# Patient Record
Sex: Female | Born: 1995 | Race: White | Hispanic: No | Marital: Single | Smoking: Never smoker
Health system: Southern US, Community
[De-identification: ages and names within clinical notes are randomized; demographics above are authoritative.]

## PROBLEM LIST (undated history)

## (undated) DIAGNOSIS — A749 Chlamydial infection, unspecified: Secondary | ICD-10-CM

## (undated) DIAGNOSIS — T7421XA Adult sexual abuse, confirmed, initial encounter: Secondary | ICD-10-CM

## (undated) DIAGNOSIS — F419 Anxiety disorder, unspecified: Secondary | ICD-10-CM

## (undated) DIAGNOSIS — J45909 Unspecified asthma, uncomplicated: Secondary | ICD-10-CM

## (undated) DIAGNOSIS — G4700 Insomnia, unspecified: Secondary | ICD-10-CM

## (undated) DIAGNOSIS — F410 Panic disorder [episodic paroxysmal anxiety] without agoraphobia: Secondary | ICD-10-CM

## (undated) DIAGNOSIS — J309 Allergic rhinitis, unspecified: Secondary | ICD-10-CM

## (undated) DIAGNOSIS — F32A Depression, unspecified: Secondary | ICD-10-CM

## (undated) DIAGNOSIS — A6000 Herpesviral infection of urogenital system, unspecified: Secondary | ICD-10-CM

## (undated) DIAGNOSIS — F99 Mental disorder, not otherwise specified: Secondary | ICD-10-CM

## (undated) HISTORY — PX: APPENDECTOMY: SHX000030

## (undated) HISTORY — PX: PR TONSILLECTOMY PRIMARY/SECONDARY AGE 12/>: 42826

## (undated) HISTORY — PX: DENTAL EXTRACTION 1-5 TEETH: SHX004133

## (undated) HISTORY — PX: APPENDECTOMY: SHX54

## (undated) HISTORY — DX: Anxiety disorder, unspecified: F41.9

## (undated) HISTORY — DX: Chlamydial infection, unspecified: A74.9

## (undated) HISTORY — DX: Allergic rhinitis, unspecified: J30.9

## (undated) HISTORY — DX: Panic disorder (episodic paroxysmal anxiety): F41.0

## (undated) HISTORY — DX: Insomnia, unspecified: G47.00

## (undated) HISTORY — DX: Adult sexual abuse, confirmed, initial encounter: T74.21XA

## (undated) HISTORY — DX: Herpesviral infection of urogenital system, unspecified: A60.00

## (undated) HISTORY — DX: Depression, unspecified: F32.A

---
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
## 2014-11-19 NOTE — Discharge Summary (Signed)
PATIENT NAME:  Rachel Mercer, Rachel Mercer MR#:  [id number] DATE OF BIRTH:  1996-01-03  DATE OF ADMISSION:  05/08/2013 DATE OF DISCHARGE: 05/10/2013   FINAL DIAGNOSIS: Acute early appendicitis.   PRINCIPLE PROCEDURE: Laparoscopic appendectomy and CT scan of the abdomen and pelvis.   HOSPITAL COURSE SUMMARY: The patient had an uneventful course. Dr. Anda KraftMarterre performed a laparoscopic appendectomy the evening of 10/10. On postoperative day #1. the patient had moderate pain control. Her diet was able to be slowly advanced. She became ambulatory and was stable for discharge on postoperative day #2. She was eating well. Her abdomen was soft. The incisions were healing nicely. Discharge instructions were provided to the patient.   DISCHARGE MEDICATIONS: Norco 1 to 2 tabs every 4 to 6 hours as needed for pain.   FOLLOWUP: In our office in 1 to 2 weeks.  ____________________________ Redge GainerMark A. Egbert GaribaldiBird, MD mab:aw D: 05/10/2013 09:39:57 ET T: 05/10/2013 09:59:08 ET JOB#: [id number]  cc: Loraine LericheMark A. Egbert GaribaldiBird, MD, <Dictator> Rachel KempMARK A Pernell Lenoir MD ELECTRONICALLY SIGNED 05/14/2013 14:39

## 2014-11-19 NOTE — Op Note (Signed)
PATIENT NAME:  Rachel Mercer, Rachel Mercer MR#:  [id number] DATE OF BIRTH:  17-Feb-1996  DATE OF PROCEDURE:  05/08/2013  OPERATION PERFORMED:  Laparoscopic appendectomy.   PREOPERATIVE DIAGNOSIS:  Acute appendicitis.   POSTOPERATIVE DIAGNOSIS:  Acute appendicitis, early.   SURGEON:  Consuela Mimes, M.D.   ANESTHESIA:  General.   PROCEDURE IN DETAIL:  The patient was placed supine on the Operating Room table and prepped and draped in the usual sterile fashion.  A limited incision was made in the supraumbilical midline and this was carried down through the linea alba and a Hassan cannula was introduced amidst horizontal mattress sutures of 0 Vicryl.  A 15 mmHg CO2 pneumoperitoneum was created and two additional 5 mm trocars were placed under direct visualization.  The patient's appendix was noted to be swollen and injected, but there was no pus or exudate or surrounding fluid.  The mesoappendix was taken down with the Harmonic scalpel and the appendectomy was performed with an Endo GIA stapling device just at the appendiceal base where it met the cecum and the appendix was placed in an Endo Catch bag and extracted from the abdomen via the supraumbilical port.  The right lower quadrant was irrigated with warm normal saline.  This was suctioned clear including the pelvis.  Both ovaries and fallopian tubes were normal.  The omentum was quite diminutive, but I was able to get a small amount of the dependent portion of the omentum over top of the appendiceal stump and then I desufflated and decannulated the abdomen and placed a single 0 PDS suture in the midline fascia and then tied the horizontal mattress sutures one to another.  All three skin sites were closed with subcuticular 5-0 Monocryl and suture strips.  The patient tolerated the procedure well.  There were no complications.     ____________________________ Consuela Mimes, MD wfm:ea D: 05/08/2013 21:16:42 ET T: 05/08/2013 23:22:06  ET JOB#: [id number]  cc: Consuela Mimes, MD, <Dictator> Consuela Mimes, MD Consuela Mimes MD ELECTRONICALLY SIGNED 05/11/2013 3:47

## 2014-11-19 NOTE — H&P (Signed)
Subjective/Chief Complaint rlq abdominal pain   History of Present Illness 19 y/o female with now 3 days of progressive abdominal pain which started in periumbilical area and now has migrated to RLQ, no sick contacts, no diarrhea no n/v, no anorexia.  imaging shows acute appendicitis.   Past History none   Past Med/Surgical Hx:  Denies medical history:   ALLERGIES:  No Known Allergies:    Other Allergies none   HOME MEDICATIONS: Medication Instructions Status  Jolessa extended cycle 30 mcg-0.15 mg oral tablet 1 tab(s) orally once a day Active  clonazePAM 0.5 mg oral tablet 1 tab(s) orally 2 times a day as needed  for anxiety, nervousness Active   Family and Social History:  Family History Non-Contributory   Social History negative tobacco, negative ETOH   Place of Living Home   Review of Systems:  Subjective/Chief Complaint see above   Abdominal Pain Yes   Physical Exam:  GEN no acute distress, thin   HEENT pale conjunctivae, PERRL, hearing intact to voice, moist oral mucosa   NECK supple   RESP normal resp effort  clear BS   CARD regular rate   ABD positive tenderness  no liver/spleen enlargement  no hernia  soft  neg rovsing's focal rlq peritoneal signs   EXTR negative cyanosis/clubbing   SKIN normal to palpation   NEURO cranial nerves intact   PSYCH A+O to time, place, person, good insight   Lab Results: Routine Chem:  10-Oct-14 16:32   Glucose, Serum 79  BUN  6  Creatinine (comp) 0.73  Sodium, Serum 135  Potassium, Serum  3.2  Chloride, Serum 102  CO2, Serum 25  Calcium (Total), Serum 9.1  Anion Gap 8  Osmolality (calc) 267  eGFR (African American) >60  eGFR (Non-African American) >60 (eGFR values <40m/min/1.73 m2 may be an indication of chronic kidney disease (CKD). Calculated eGFR is useful in patients with stable renal function. The eGFR calculation will not be reliable in acutely ill patients when serum creatinine is changing  rapidly. It is not useful in  patients on dialysis. The eGFR calculation may not be applicable to patients at the low and high extremes of body sizes, pregnant women, and vegetarians.)  Routine UA:  10-Oct-14 18:30   Color (UA) Straw  Clarity (UA) Clear  Glucose (UA) Negative  Bilirubin (UA) Negative  Ketones (UA) 2+  Specific Gravity (UA) 1.045  Blood (UA) Negative  pH (UA) 5.0  Protein (UA) Negative  Nitrite (UA) Negative  Leukocyte Esterase (UA) Negative (Result(s) reported on 08 May 2013 at 06:42PM.)  RBC (UA) <1 /HPF  WBC (UA) 2 /HPF  Bacteria (UA) TRACE  Epithelial Cells (UA) 2 /HPF  Mucous (UA) PRESENT (Result(s) reported on 08 May 2013 at 06:42PM.)  Routine Sero:  10-Oct-14 18:30   Pregnancy Test, Urine NEGATIVE (The results of the qualitative urine HCG (Pregnancy Test) should be evaluated in light of other clinical information.  There are limitations to the test which, in certain clinical situations, may result in a false positive or negative result. Thehigh dose hook effect can occur in urine samples with extremely high HCG concentrations.  This effect can produce a negative result in certain situations. It is suggested that results of the qualitative HCG be confirmed by an alternate methodology, such as the quantitative serum beta HCG test.)  Routine Hem:  10-Oct-14 16:32   WBC (CBC)  12.7  RBC (CBC) 4.21  Hemoglobin (CBC) 13.7  Hematocrit (CBC) 39.2  Platelet Count (CBC)  253 (Result(s) reported on 08 May 2013 at 04:50PM.)  MCV 93  MCH 32.5  MCHC 34.9  RDW 12.9   Radiology Results: LabUnknown:    10-Oct-14 15:23, CT Abdomen and Pelvis With Contrast  PACS Image  CT:  CT Abdomen and Pelvis With Contrast  REASON FOR EXAM:    Call Report  [phone]  epigastric painLLQ abd pain   nausea  COMMENTS:       PROCEDURE: CT  - CT ABDOMEN / PELVIS  W  - May 08 2013  3:23PM     RESULT: History: Epigastric pain    Comparison:  None    Technique: Multiple axial  images of the abdomen and pelvis were performed   from the lung bases to the pubic symphysis, with p.o. contrast and with   100 ml of Isovue 370 intravenous contrast.    Findings:  The lung bases are clear. There is no pneumothorax. The heart size is   normal.    The liver demonstrates no focal abnormality. There is no intrahepatic or   extrahepatic biliary ductal dilatation. The gallbladder is unremarkable.   The spleen demonstrates no focal abnormality. The kidneys, adrenal   glands, and pancreas are normal. The bladder is unremarkable.     The stomach, duodenum, small intestine, and large intestine demonstrate   no contrast extravasation or dilatation. The appendix is dilated   measuring 12 mm with mild periappendiceal inflammatory changes most   concerning for acute appendicitis. There is no periappendiceal fluid   collection to suggest an abscess. There is a small amount of pelvic free   fluid. There is no pneumoperitoneum, pneumatosis, or portal venous gas.   There is no lymphadenopathy.   The abdominal aorta is normal in caliber.    The osseous structures are unremarkable.    IMPRESSION:     1. Findings most concerning for acute appendicitis. These findings were   communicated to Carmon Ginsberg on 05/08/2013 at 1539 hours.    Dictation Site: 1        Verified By: Jennette Banker, M.D., MD    Assessment/Admission Diagnosis 19 y/o with acute appendictis   Plan lap appendectomy will discuss with Dr. Leanora Cover   Electronic Signatures: Sherri Rad (MD)  (Signed 10-Oct-14 19:06)  Authored: CHIEF COMPLAINT and HISTORY, PAST MEDICAL/SURGIAL HISTORY, ALLERGIES, Other Allergies, HOME MEDICATIONS, FAMILY AND SOCIAL HISTORY, REVIEW OF SYSTEMS, PHYSICAL EXAM, LABS, Radiology, ASSESSMENT AND PLAN   Last Updated: 10-Oct-14 19:06 by Sherri Rad (MD)

## 2015-02-16 ENCOUNTER — Encounter: Payer: Self-pay | Admitting: Family Medicine

## 2015-02-16 ENCOUNTER — Ambulatory Visit (INDEPENDENT_AMBULATORY_CARE_PROVIDER_SITE_OTHER): Payer: 59 | Admitting: Family Medicine

## 2015-02-16 ENCOUNTER — Other Ambulatory Visit: Payer: Self-pay

## 2015-02-16 VITALS — BP 108/68 | HR 95 | Temp 98.2°F | Resp 16 | Wt 127.8 lb

## 2015-02-16 DIAGNOSIS — J302 Other seasonal allergic rhinitis: Secondary | ICD-10-CM

## 2015-02-16 DIAGNOSIS — T485X1A Poisoning by other anti-common-cold drugs, accidental (unintentional), initial encounter: Secondary | ICD-10-CM | POA: Diagnosis not present

## 2015-02-16 DIAGNOSIS — T485X5A Adverse effect of other anti-common-cold drugs, initial encounter: Secondary | ICD-10-CM | POA: Insufficient documentation

## 2015-02-16 DIAGNOSIS — J310 Chronic rhinitis: Secondary | ICD-10-CM

## 2015-02-16 DIAGNOSIS — G4701 Insomnia due to medical condition: Secondary | ICD-10-CM | POA: Insufficient documentation

## 2015-02-16 DIAGNOSIS — F410 Panic disorder [episodic paroxysmal anxiety] without agoraphobia: Secondary | ICD-10-CM | POA: Insufficient documentation

## 2015-02-16 MED ORDER — MOMETASONE FUROATE 50 MCG/ACT NA SUSP: NASAL | Status: DC

## 2015-02-16 NOTE — Progress Notes (Signed)
Subjective:     Patient ID: Rachel Mercer, female   DOB: 18-Sep-1995, 19 y.o.   MRN: [id number]  HPI  Chief Complaint  Patient presents with  . Nasal Congestion    patient comes in office today with complaints of sinus congestion and runny nose for more than 3 months. Patient has taken OTC MUcinex, nasal spray and Affrin with no relief.   Reports using Afrin daily-drainage is clear. States her allergy sx usually bother her in the Spring. Accompanied by her mother today.   Review of Systems  Constitutional: Negative for fever and chills.       Objective:   Physical Exam  Constitutional: She appears well-developed and well-nourished. No distress.  Ears: T.M's intact without inflammation Sinuses: non-tender Throat: no tonsillar enlargement or exudate Neck: no cervical adenopathy Lungs: clear     Assessment:    1. Rhinitis medicamentosa - mometasone (NASONEX) 50 MCG/ACT nasal spray; Two squirts each nostril daily.  Dispense: 17 g; Refill: 2  2. Allergic rhinitis, seasonal - mometasone (NASONEX) 50 MCG/ACT nasal spray; Two squirts each nostril daily.  Dispense: 17 g; Refill: 2    Plan:    Discussed proper technique for use of steroid spray. To stop Afrin but may use saline nasal spray.

## 2015-02-16 NOTE — Patient Instructions (Signed)
Discussed use of saline nasal spray.

## 2015-03-02 ENCOUNTER — Ambulatory Visit (INDEPENDENT_AMBULATORY_CARE_PROVIDER_SITE_OTHER): Payer: 59 | Admitting: Family Medicine

## 2015-03-02 ENCOUNTER — Encounter: Payer: Self-pay | Admitting: Family Medicine

## 2015-03-02 VITALS — BP 114/76 | HR 82 | Temp 98.4°F | Resp 16 | Wt 129.2 lb

## 2015-03-02 DIAGNOSIS — J069 Acute upper respiratory infection, unspecified: Secondary | ICD-10-CM | POA: Diagnosis not present

## 2015-03-02 NOTE — Progress Notes (Signed)
Subjective:     Patient ID: Rachel Mercer, female   DOB: 03/01/1996, 19 y.o.   MRN: [id number]  HPI  Chief Complaint  Patient presents with  . Sore Throat    Patient comes in office today with concerns of sore throat over the past 24hrs, patient states that today her left side is hurting more than right. Patient denies difficulty when swallowing liquids or solids, she reports no fever but states that she has had congestion and cough. Patient took Ibuprofen last night for pain, she denies being exposed to strep.   She has successfully quit chronic use of nasal decongestant spray. Accompanied by her mom today.   Review of Systems  Constitutional: Negative for fever and chills (mild body aches).       Objective:   Physical Exam  Constitutional: She appears well-developed and well-nourished. She has a sickly appearance.  Ears: T.M's intact without inflammation Throat: mild posterior pharyngeal erythema; tonsils absent Neck: no cervical adenopathy Lungs: clear     Assessment:    1. Upper respiratory infection     Plan:    Discussed use of Mucinex D for congestion, Delsym for cough, and Nyquil. Work excuse for 8/3-8/7 for [employer].

## 2015-03-02 NOTE — Patient Instructions (Signed)
Discussed use of Mucinex D, Nyquil, and Delsym.

## 2015-04-07 ENCOUNTER — Ambulatory Visit: Payer: 59 | Admitting: Family Medicine

## 2015-04-07 ENCOUNTER — Telehealth: Payer: Self-pay | Admitting: Family Medicine

## 2015-04-07 NOTE — Telephone Encounter (Signed)
I can't write a work excuse if I did not see the patient for the illness.

## 2015-04-07 NOTE — Telephone Encounter (Signed)
Mother had informed Rachel Mercer that patient had not been seen in the office recently or for this illness, and that patient plans to return back to work tomorrow. Please advise as to what you would like to do. KW

## 2015-04-07 NOTE — Telephone Encounter (Signed)
Patient has been advised. KW 

## 2015-04-07 NOTE — Telephone Encounter (Signed)
Pt's mother is calling stating that the pt needs a Dr.'s note for work. Pt was out sick on Tuesday 04-05-15 & Wednesday 04-06-15.

## 2015-05-30 ENCOUNTER — Ambulatory Visit (INDEPENDENT_AMBULATORY_CARE_PROVIDER_SITE_OTHER): Payer: 59 | Admitting: Family Medicine

## 2015-05-30 ENCOUNTER — Encounter: Payer: Self-pay | Admitting: Family Medicine

## 2015-05-30 ENCOUNTER — Other Ambulatory Visit: Payer: Self-pay

## 2015-05-30 VITALS — BP 98/78 | HR 92 | Temp 98.7°F | Resp 18 | Wt 128.0 lb

## 2015-05-30 DIAGNOSIS — R062 Wheezing: Secondary | ICD-10-CM

## 2015-05-30 DIAGNOSIS — R05 Cough: Secondary | ICD-10-CM | POA: Diagnosis not present

## 2015-05-30 DIAGNOSIS — F410 Panic disorder [episodic paroxysmal anxiety] without agoraphobia: Secondary | ICD-10-CM

## 2015-05-30 DIAGNOSIS — R059 Cough, unspecified: Secondary | ICD-10-CM

## 2015-05-30 MED ORDER — FLUNISOLIDE HFA 80 MCG/ACT IN AERS: 80.0000 ug | INHALATION_SPRAY | Freq: Two times a day (BID) | RESPIRATORY_TRACT | Status: DC

## 2015-05-30 NOTE — Patient Instructions (Signed)

## 2015-05-30 NOTE — Progress Notes (Signed)
Patient ID: Rachel StallKatelyn L Dorantes, female   DOB: [birth year], 19 y.o.   MRN: [id number] Name: Rachel Mercer   MRN: [id number]    DOB: [birth year]   Date:05/30/2015       Progress Note  Subjective  Chief Complaint  Chief Complaint  Patient presents with  . Anxiety  . Cough  . Wheezing    Anxiety The problem has been waxing and waning. Symptoms include chest pain, nervous/anxious behavior and panic. Primary symptoms comment: followed by Dr. Evelene CroonKaur and treated with Propranolol and  Clonazepam. Symptoms occur occasionally. The symptoms are aggravated by family issues and social activities.   Risk factors include recent illness (cough and wheeze). Her past medical history is significant for anxiety/panic attacks. Past treatments include benzodiazephines.  Cough This is a new problem. The current episode started in the past 7 days. The problem has been waxing and waning. The cough is non-productive. Associated symptoms include chest pain, nasal congestion, postnasal drip and wheezing. Pertinent negatives include no fever or sore throat. The symptoms are aggravated by stress. She has tried prescription cough suppressant for the symptoms. The treatment provided mild relief.  Wheezing  This is a new problem. The current episode started in the past 7 days. The problem occurs intermittently (worse at night). The problem has been waxing and waning. Associated symptoms include chest pain and coughing. Pertinent negatives include no fever, sore throat or sputum production. The symptoms are aggravated by emotional upset. She has tried nothing for the symptoms.   Past Surgical History  Procedure Laterality Date  . Tonsillectomy  2008   Family History  Problem Relation Age of Onset  . Depression Mother   . Mental illness Mother   . Irritable bowel syndrome Mother   . Colon polyps Mother   . Bipolar disorder Sister   . Mental illness Sister   . Seizures Sister   . Cancer Maternal Grandmother     colon   . Alzheimer's disease Maternal Grandmother   . Alzheimer's disease Maternal Grandfather    Past Medical History  Diagnosis Date  . Allergic rhinitis     seasonal  . Insomnia   . Panic disorder    Social History  Substance Use Topics  . Smoking status: Never Smoker   . Smokeless tobacco: Never Used  . Alcohol Use: 0.0 oz/week    0 Standard drinks or equivalent per week     Comment: occasional    Current outpatient prescriptions:  .  BENZEPRO SHORT CONTACT 9.8 % FOAM, , Disp: , Rfl:  .  clonazePAM (KLONOPIN) 1 MG tablet, , Disp: , Rfl: 0 .  levonorgestrel-ethinyl estradiol (JOLESSA) 0.15-0.03 MG tablet, Take by mouth., Disp: , Rfl:  .  mometasone (NASONEX) 50 MCG/ACT nasal spray, Two squirts each nostril daily., Disp: 17 g, Rfl: 2 .  propranolol (INDERAL) 20 MG tablet, Take 20 mg by mouth 2 (two) times daily., Disp: , Rfl: 1 .  valACYclovir (VALTREX) 500 MG tablet, , Disp: , Rfl:   No Known Allergies  Review of Systems  Constitutional: Negative.  Negative for fever.  HENT: Positive for postnasal drip. Negative for sore throat.   Eyes: Negative.   Respiratory: Positive for cough and wheezing. Negative for sputum production.   Cardiovascular: Positive for chest pain.  Gastrointestinal: Negative.   Genitourinary: Negative.   Musculoskeletal: Negative.   Skin: Negative.   Neurological: Negative.   Endo/Heme/Allergies: Negative.   Psychiatric/Behavioral: The patient is nervous/anxious.    Objective  Filed Vitals:  05/30/15 1436  BP: 98/78  Pulse: 92  Temp: 98.7 F (37.1 C)  TempSrc: Oral  Resp: 18  Weight: 128 lb (58.06 kg)   Physical Exam  Constitutional: She is oriented to person, place, and time and well-developed, well-nourished, and in no distress.  HENT:  Head: Normocephalic.  Right Ear: External ear normal.  Left Ear: External ear normal.  Nose: Nose normal.  Mouth/Throat: Oropharynx is clear and moist.  Slight cobblestone appearance to posterior  throat.  Eyes: Conjunctivae and EOM are normal.  Neck: Normal range of motion. Neck supple.  Cardiovascular: Normal rate, regular rhythm and normal heart sounds.   Pulmonary/Chest: She has wheezes. She has no rales.  Slight wheeze that clears with deep breath or cough.  Lymphadenopathy:    She has no cervical adenopathy.  Neurological: She is alert and oriented to person, place, and time.  Psychiatric: Her mood appears anxious. She expresses no suicidal plans and no homicidal plans.    Assessment & Plan  1. Cough Recent onset of dry cough over the past 5-7 days. Some nasal congestion and post nasal drip. No sputum production. May continue Robitussin-DM or Delsym and add Claritin at night. Increase fluid intake. Recheck as needed.  2. Inspiratory wheeze on examination Onset with recent cough. Causes more cough and increase in anxiety/panic. No significant wheeze at the present. Will treat with ICS (bronchodilator would probably increase anxiety level or trigger panic attack). Recheck if no better in 2 weeks. - Flunisolide HFA 80 MCG/ACT AERS; Inhale 80 mcg into the lungs 2 (two) times daily.  Dispense: 1 Inhaler; Refill: 0  3. Episodic paroxysmal anxiety disorder Chronic issue with relief from Clonazepam and Propranolol. Does not take these daily because each one causes sleepiness. Followed by Dr. Evelene Croon who has suggested a SSRI (but patient fearful). Follow up with psychiatrist.

## 2015-06-24 ENCOUNTER — Telehealth: Payer: Self-pay

## 2015-06-24 NOTE — Telephone Encounter (Signed)
Patient called and states she needs another sample of the inhaler you gave her last time please.

## 2015-06-25 ENCOUNTER — Other Ambulatory Visit: Payer: Self-pay | Admitting: Family Medicine

## 2015-06-25 DIAGNOSIS — R062 Wheezing: Secondary | ICD-10-CM

## 2015-06-25 MED ORDER — FLUNISOLIDE HFA 80 MCG/ACT IN AERS: 80.0000 ug | INHALATION_SPRAY | Freq: Two times a day (BID) | RESPIRATORY_TRACT | Status: DC

## 2015-06-25 NOTE — Telephone Encounter (Signed)
Recurrence of wheezing with recent cough. No fever or congestion expectorated. Refilled Pulmicort inhaler and advised to recheck in the office if wheezing persists. May need addition of bronchodilator. Patient agrees with plan.

## 2015-06-27 ENCOUNTER — Other Ambulatory Visit: Payer: Self-pay | Admitting: Family Medicine

## 2015-06-27 DIAGNOSIS — J4530 Mild persistent asthma, uncomplicated: Secondary | ICD-10-CM

## 2015-06-27 MED ORDER — BECLOMETHASONE DIPROPIONATE 80 MCG/ACT IN AERS: 2.0000 | INHALATION_SPRAY | Freq: Two times a day (BID) | RESPIRATORY_TRACT | Status: DC

## 2015-07-28 NOTE — Telephone Encounter (Signed)
done

## 2015-08-05 ENCOUNTER — Telehealth: Payer: Self-pay | Admitting: Family Medicine

## 2015-08-05 ENCOUNTER — Encounter: Payer: Self-pay | Admitting: Family Medicine

## 2015-08-05 ENCOUNTER — Ambulatory Visit (INDEPENDENT_AMBULATORY_CARE_PROVIDER_SITE_OTHER): Payer: 59 | Admitting: Family Medicine

## 2015-08-05 VITALS — BP 98/78 | HR 104 | Temp 98.7°F | Resp 16 | Wt 127.8 lb

## 2015-08-05 DIAGNOSIS — R05 Cough: Secondary | ICD-10-CM

## 2015-08-05 DIAGNOSIS — Z8709 Personal history of other diseases of the respiratory system: Secondary | ICD-10-CM | POA: Diagnosis not present

## 2015-08-05 DIAGNOSIS — R058 Other specified cough: Secondary | ICD-10-CM

## 2015-08-05 DIAGNOSIS — Z87898 Personal history of other specified conditions: Secondary | ICD-10-CM

## 2015-08-05 MED ORDER — FLUTICASONE FUROATE-VILANTEROL 100-25 MCG/INH IN AEPB: INHALATION_SPRAY | RESPIRATORY_TRACT | Status: DC

## 2015-08-05 NOTE — Telephone Encounter (Signed)
My inclination is to say no to this. Thoughts?

## 2015-08-05 NOTE — Progress Notes (Signed)
Patient ID: Rachel Mercer, female   DOB: 1996/04/27, 20 y.o.   MRN: [id number]   Patient: Rachel Mercer Female    DOB: [birth year]   20 y.o.   MRN: [id number] Visit Date: 08/05/2015  Today's Provider: Dortha Kern, PA   Chief Complaint  Patient presents with  . Wheezing   Subjective:    Wheezing  This is a recurrent problem. Associated symptoms include chest pain, coughing and shortness of breath. Pertinent negatives include no ear pain, fever, headaches, hemoptysis, rhinorrhea or sore throat. Associated symptoms comments: History of anxiety disorder.   Patient Active Problem List   Diagnosis Date Noted  . Episodic paroxysmal anxiety disorder 02/16/2015  . 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 Allergic rhinitis, seasonal 02/16/2015  . Rhinitis medicamentosa 02/16/2015   Past Surgical History  Procedure Laterality Date  . Tonsillectomy  2008   Family History  Problem Relation Age of Onset  . Depression Mother   . Mental illness Mother   . Irritable bowel syndrome Mother   . Colon polyps Mother   . Bipolar disorder Sister   . Mental illness Sister   . Seizures Sister   . Cancer Maternal Grandmother     colon  . Alzheimer's disease Maternal Grandmother   . Alzheimer's disease Maternal Grandfather    No Known Allergies   Previous Medications   BECLOMETHASONE (QVAR) 80 MCG/ACT INHALER    Inhale 2 puffs into the lungs 2 (two) times daily.   BENZEPRO SHORT CONTACT 9.8 % FOAM       CLONAZEPAM (KLONOPIN) 1 MG TABLET       LEVONORGESTREL-ETHINYL ESTRADIOL (JOLESSA) 0.15-0.03 MG TABLET    Take by mouth.   MOMETASONE (NASONEX) 50 MCG/ACT NASAL SPRAY    Two squirts each nostril daily.   PROPRANOLOL (INDERAL) 20 MG TABLET    Take 20 mg by mouth 2 (two) times daily.   VALACYCLOVIR (VALTREX) 500 MG TABLET        Review of Systems  Constitutional: Negative.  Negative for fever.  HENT: Negative.  Negative for ear pain, rhinorrhea and sore throat.   Eyes:  Negative.   Respiratory: Positive for cough, shortness of breath and wheezing. Negative for hemoptysis.   Cardiovascular: Positive for chest pain.  Gastrointestinal: Negative.   Endocrine: Negative.   Genitourinary: Negative.   Musculoskeletal: Negative.   Skin: Negative.   Allergic/Immunologic: Negative.   Neurological: Negative.  Negative for headaches.  Hematological: Negative.   Psychiatric/Behavioral: Negative.     Social History  Substance Use Topics  . Smoking status: Never Smoker   . Smokeless tobacco: Never Used  . Alcohol Use: 0.0 oz/week    0 Standard drinks or equivalent per week     Comment: occasional   Objective:   BP 98/78 mmHg  Pulse 104  Temp(Src) 98.7 F (37.1 C) (Oral)  Resp 16  Wt 127 lb 12.8 oz (57.97 kg)  SpO2 98%  Physical Exam  Constitutional: She is oriented to person, place, and time. She appears well-developed and well-nourished. No distress.  HENT:  Head: Normocephalic and atraumatic.  Right Ear: Hearing normal.  Left Ear: Hearing normal.  Nose: Nose normal.  Eyes: Conjunctivae and lids are normal. Right eye exhibits no discharge. Left eye exhibits no discharge. No scleral icterus.  Cardiovascular: Normal rate and regular rhythm.   Pulmonary/Chest: Effort normal. No respiratory distress.  Abdominal: Bowel sounds are normal.  Musculoskeletal: Normal range of motion.  Neurological: She is alert and oriented to person,  place, and time.  Skin: Skin is intact. No lesion and no rash noted.  Psychiatric: She has a normal mood and affect. Her speech is normal and behavior is normal. Thought content normal.      Assessment & Plan:     1. Recurrent cough Usually have cough and wheezing at night or early morning. Does not feel the QVAR is working very well now. No sputum production. No recheck reflux symptoms. Will stop the QVAR and give a trial of Breo. If no improvement, will need to consider allergist or pulmonologist referral. - Fluticasone  Furoate-Vilanterol 100-25 MCG/INH AEPB; One inhalation daily at bedtime.  Dispense: 14 each; Refill: 0  2. History of wheezing No wheezing at the present and very good pulse oximetry. Given Breoi to use once a day for control. - Fluticasone Furoate-Vilanterol 100-25 MCG/INH AEPB; One inhalation daily at bedtime.  Dispense: 14 each; Refill: 0

## 2015-08-05 NOTE — Telephone Encounter (Signed)
Pts mom wants to know if you will refill Clonazepam since she has been diagnosed by Dr. Helane RimaKauer.   Dr. Helane RimaKauer no longer accepts her insurance per mom.    Please let Misty StanleyLisa know.

## 2015-08-09 NOTE — Telephone Encounter (Signed)
Can fill as a bridge, and would need ov for this, but needs to establish with need psychiatrist. Thanks.

## 2015-08-09 NOTE — Telephone Encounter (Signed)
Advised mother as below. Appt scheduled for next week.

## 2015-08-18 ENCOUNTER — Ambulatory Visit (INDEPENDENT_AMBULATORY_CARE_PROVIDER_SITE_OTHER): Payer: 59 | Admitting: Family Medicine

## 2015-08-18 ENCOUNTER — Encounter: Payer: Self-pay | Admitting: Family Medicine

## 2015-08-18 VITALS — BP 102/72 | HR 92 | Temp 99.1°F | Resp 16 | Wt 129.0 lb

## 2015-08-18 DIAGNOSIS — F410 Panic disorder [episodic paroxysmal anxiety] without agoraphobia: Secondary | ICD-10-CM

## 2015-08-18 DIAGNOSIS — J4530 Mild persistent asthma, uncomplicated: Secondary | ICD-10-CM

## 2015-08-18 DIAGNOSIS — F32A Depression, unspecified: Secondary | ICD-10-CM | POA: Insufficient documentation

## 2015-08-18 DIAGNOSIS — F419 Anxiety disorder, unspecified: Secondary | ICD-10-CM | POA: Diagnosis not present

## 2015-08-18 DIAGNOSIS — J45909 Unspecified asthma, uncomplicated: Secondary | ICD-10-CM | POA: Insufficient documentation

## 2015-08-18 MED ORDER — ESCITALOPRAM OXALATE 10 MG PO TABS
10.0000 mg | ORAL_TABLET | Freq: Every day | ORAL | Status: DC
Start: 2015-08-18 — End: 2015-09-08

## 2015-08-18 MED ORDER — CLONAZEPAM 1 MG PO TABS: 1.0000 mg | ORAL_TABLET | Freq: Two times a day (BID) | ORAL | Status: DC | PRN

## 2015-08-18 MED ORDER — ALBUTEROL SULFATE HFA 108 (90 BASE) MCG/ACT IN AERS: 2.0000 | INHALATION_SPRAY | Freq: Four times a day (QID) | RESPIRATORY_TRACT | Status: DC | PRN

## 2015-08-18 NOTE — Progress Notes (Signed)
Subjective:    Patient ID: Rachel Mercer, female    DOB: [birth year], 20 y.o.   MRN: [id number]  Anxiety Presents for follow-up visit. Symptoms include chest pain (with panic attacks), decreased concentration, depressed mood (intermittently), excessive worry, hyperventilation, muscle tension, nausea, nervous/anxious behavior, palpitations, panic, restlessness and shortness of breath. Patient reports no compulsions, confusion, dizziness, dry mouth, feeling of choking, insomnia (does have trouble falling asleep), irritability, obsessions or suicidal ideas. Symptoms occur occasionally. The severity of symptoms is severe (panic attacks are severe). The symptoms are aggravated by specific phobias ("conflict"). The quality of sleep is good.   Past treatments include benzodiazephines (Clonazepam). The treatment provided moderate ("Not quite strong enough" when pt is having panic attack) relief. Compliance with prior treatments has been good.  Pt needs refill on Klonopin, and also needs referral to psychiatrist in pt's network.     Review of Systems  Constitutional: Negative for irritability.  Respiratory: Positive for shortness of breath.   Cardiovascular: Positive for chest pain (with panic attacks) and palpitations.  Gastrointestinal: Positive for nausea.  Neurological: Negative for dizziness.  Psychiatric/Behavioral: Positive for decreased concentration. Negative for suicidal ideas and confusion. The patient is nervous/anxious. The patient does not have insomnia (does have trouble falling asleep).    BP 102/72 mmHg  Pulse 92  Temp(Src) 99.1 F (37.3 C) (Oral)  Resp 16  Wt 129 lb (58.514 kg)  LMP 06/18/2015 (Within Weeks)   Patient Active Problem List   Diagnosis Date Noted  . Episodic paroxysmal anxiety disorder 02/16/2015  . 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 Allergic rhinitis, seasonal 02/16/2015  . Rhinitis medicamentosa 02/16/2015   Past Medical History    Diagnosis Date  . Allergic rhinitis     seasonal  . Insomnia   . Panic disorder    Current Outpatient Prescriptions on File Prior to Visit  Medication Sig  . clonazePAM (KLONOPIN) 1 MG tablet   . Fluticasone Furoate-Vilanterol 100-25 MCG/INH AEPB One inhalation daily at bedtime.  Marland Kitchen levonorgestrel-ethinyl estradiol (JOLESSA) 0.15-0.03 MG tablet Take by mouth.  . propranolol (INDERAL) 20 MG tablet Take 20 mg by mouth 2 (two) times daily.  . valACYclovir (VALTREX) 500 MG tablet   . BENZEPRO SHORT CONTACT 9.8 % FOAM Reported on 08/18/2015   No current facility-administered medications on file prior to visit.   No Known Allergies Past Surgical History  Procedure Laterality Date  . Tonsillectomy  2008   Social History   Social History  . Marital Status: Single    Spouse Name: N/A  . Number of Children: N/A  . Years of Education: N/A   Occupational History  . Not on file.   Social History Main Topics  . Smoking status: Never Smoker   . Smokeless tobacco: Never Used  . Alcohol Use: 0.0 oz/week    0 Standard drinks or equivalent per week     Comment: occasional  . Drug Use: Yes    Special: Marijuana  . Sexual Activity: Yes    Birth Control/ Protection: Pill   Other Topics Concern  . Not on file   Social History Narrative   Family History  Problem Relation Age of Onset  . Depression Mother   . Mental illness Mother   . Irritable bowel syndrome Mother   . Colon polyps Mother   . Bipolar disorder Sister   . Mental illness Sister   . Seizures Sister   . Cancer Maternal Grandmother     colon  . Alzheimer's  disease Maternal Grandmother   . Alzheimer's disease Maternal Grandfather       Objective:   Physical Exam  Constitutional: She is oriented to person, place, and time. She appears well-developed and well-nourished.  Neurological: She is alert and oriented to person, place, and time.  Psychiatric: She has a normal mood and affect. Her behavior is normal.  Judgment and thought content normal.   BP 102/72 mmHg  Pulse 92  Temp(Src) 99.1 F (37.3 C) (Oral)  Resp 16  Wt 129 lb (58.514 kg)  LMP 06/18/2015 (Within Weeks)      Assessment & Plan:   1. Episodic paroxysmal anxiety disorder Continue clonazepam  As needed for anxiety attacks.  Explained not to increase medication.    - clonazePAM (KLONOPIN) 1 MG tablet; Take 1 tablet (1 mg total) by mouth 2 (two) times daily as needed for anxiety.  Dispense: 30 tablet; Refill: 0  2. Anxiety Worsening.  Has increased her clonazepam. Explained that this is not good long term treatment to her anxiety.  Willing to try Lexapro. Contracted for safety.   - Ambulatory referral to Psychiatry - escitalopram (LEXAPRO) 10 MG tablet; Take 1 tablet (10 mg total) by mouth daily. 1/2 for 7 days and then 1 a day  Dispense: 30 tablet; Refill: 1  3. Asthma, mild persistent, uncomplicated Stable. Refilled medication.   - albuterol (PROVENTIL HFA;VENTOLIN HFA) 108 (90 Base) MCG/ACT inhaler; Inhale 2 puffs into the lungs every 6 (six) hours as needed for wheezing or shortness of breath.  Dispense: 1 Inhaler; Refill: 2   Patient was seen and examined by Leo Grosser, MD, and note scribed by Allene Dillon, CMA. I have reviewed the document for accuracy and completeness and I agree with above. Leo Grosser, MD   Lorie Phenix, MD

## 2015-08-24 ENCOUNTER — Telehealth: Payer: Self-pay

## 2015-08-24 ENCOUNTER — Other Ambulatory Visit: Payer: Self-pay | Admitting: Family Medicine

## 2015-08-24 DIAGNOSIS — R05 Cough: Secondary | ICD-10-CM

## 2015-08-24 DIAGNOSIS — Z87898 Personal history of other specified conditions: Secondary | ICD-10-CM

## 2015-08-24 DIAGNOSIS — R058 Other specified cough: Secondary | ICD-10-CM

## 2015-08-24 DIAGNOSIS — F419 Anxiety disorder, unspecified: Secondary | ICD-10-CM

## 2015-08-24 MED ORDER — FLUTICASONE FUROATE-VILANTEROL 100-25 MCG/INH IN AEPB: INHALATION_SPRAY | RESPIRATORY_TRACT | Status: DC

## 2015-08-24 MED ORDER — SERTRALINE HCL 25 MG PO TABS: 25.0000 mg | ORAL_TABLET | Freq: Every day | ORAL | Status: DC

## 2015-08-24 NOTE — Telephone Encounter (Signed)
Patient agreed to starting sertraline. sd

## 2015-08-24 NOTE — Telephone Encounter (Signed)
Patient reports that she has only been taking 1/2 tablet. Please advise. sd

## 2015-08-24 NOTE — Telephone Encounter (Signed)
Fairly common side effect when starting Lexapro. Recommend she cut back to 1/2 tablet daily for about a week, then try going back up to a full  tablet.

## 2015-08-24 NOTE — Telephone Encounter (Signed)
Then i would suggest changing to sertraline  1/2 tablet daily for 6 days, then increase to 1 tablet a day for six days, then increase to 2 a day. #30, rf x 0. Sertraline works well for anxiety and panic attacks and is less likely to cause sleepiness than Lexapro.

## 2015-08-24 NOTE — Telephone Encounter (Signed)
Patient's mother reports that patient has been very sleepy and no energy for about 5 days. Patient concerned that Lexapro 10 mg is causing her symptoms. Please advise.

## 2015-09-01 ENCOUNTER — Telehealth: Payer: Self-pay

## 2015-09-01 NOTE — Telephone Encounter (Signed)
Pt called to report that she recently changed from Lexapro to Zoloft.  She says she is having the same side Effects with Zoloft.  (Feeling very sleepy, not wanting to do anything.)  She is wondering what the next step would be.  She state her parents are looking for psychiatist that their insurance will pay for.  Can she try a different medicine or does she need another appointment?  Thanks,   -[name]

## 2015-09-01 NOTE — Telephone Encounter (Signed)
Would recommend follow up with psychiatrist before we try another medication. Thanks.

## 2015-09-02 ENCOUNTER — Telehealth: Payer: Self-pay | Admitting: Family Medicine

## 2015-09-02 DIAGNOSIS — F419 Anxiety disorder, unspecified: Secondary | ICD-10-CM

## 2015-09-02 DIAGNOSIS — F410 Panic disorder [episodic paroxysmal anxiety] without agoraphobia: Secondary | ICD-10-CM

## 2015-09-02 NOTE — Telephone Encounter (Signed)
Pt's mother Misty Stanley Cornacchia) called requesting referral for pt to see a psychiatrist for anxiety and panic attacks.Call back # is [phone]

## 2015-09-02 NOTE — Telephone Encounter (Signed)
Ok to refer. Thanks

## 2015-09-02 NOTE — Telephone Encounter (Signed)
Pt advised; she is going to call back for a referral when she finds a psychiatrist.  Thanks,   [name]

## 2015-09-03 NOTE — Telephone Encounter (Signed)
Order placed in chart. Please refer. Thanks!

## 2015-09-08 ENCOUNTER — Ambulatory Visit (INDEPENDENT_AMBULATORY_CARE_PROVIDER_SITE_OTHER): Payer: 59 | Admitting: Physician Assistant

## 2015-09-08 ENCOUNTER — Ambulatory Visit
Admission: RE | Admit: 2015-09-08 | Discharge: 2015-09-08 | Disposition: A | Discharge status: Home / Self Care | Payer: 59 | Source: Ambulatory Visit | Attending: Physician Assistant | Admitting: Physician Assistant

## 2015-09-08 ENCOUNTER — Encounter: Payer: Self-pay | Admitting: Physician Assistant

## 2015-09-08 VITALS — BP 110/78 | HR 93 | Temp 97.1°F | Resp 16 | Wt 125.8 lb

## 2015-09-08 DIAGNOSIS — R110 Nausea: Secondary | ICD-10-CM | POA: Diagnosis not present

## 2015-09-08 DIAGNOSIS — R1011 Right upper quadrant pain: Secondary | ICD-10-CM | POA: Diagnosis not present

## 2015-09-08 DIAGNOSIS — J4530 Mild persistent asthma, uncomplicated: Secondary | ICD-10-CM | POA: Diagnosis not present

## 2015-09-08 MED ORDER — FLUNISOLIDE HFA 80 MCG/ACT IN AERS: 1.0000 | INHALATION_SPRAY | Freq: Two times a day (BID) | RESPIRATORY_TRACT | Status: DC

## 2015-09-08 NOTE — Progress Notes (Signed)
Patient: Rachel Mercer Female    DOB: 19-Sep-1995   19 y.o.   MRN: [id number] Visit Date: 09/08/2015  Today's Provider: Margaretann Loveless, PA-C   Chief Complaint  Patient presents with  . Medication Reaction   Subjective:    HPI Rachel Mercer is a 20 year old female that is here today concern that is having rapid pulse, feeling restless, nauseous, shaky, chest tightness, sometimes chest pain together with chest tightness like something is putting pressure on her chest, appetite change, and twitching. Per patient started having this since she was put on the Breo one month ago. Nausea is new. She states that this started approximately 2 days ago and has gradually worsened. She states today she had no appetite at all because she was so nauseous yesterday that she did not want to feel that way again today so she just has not eaten. States she did eat some wheezing last night and had some abdominal pain and bloating sensation. She states that she also had some loose stools yesterday and one episode of urgent diarrhea but had a normal bowel movement today. She denies any vomiting, hematochezia or melena. She states that she also has regular menstrual cycles and has not had any issues since being on her birth control.     No Known Allergies Previous Medications   ALBUTEROL (PROVENTIL HFA;VENTOLIN HFA) 108 (90 BASE) MCG/ACT INHALER    Inhale 2 puffs into the lungs every 6 (six) hours as needed for wheezing or shortness of breath.   BENZEPRO SHORT CONTACT 9.8 % FOAM    Reported on 09/08/2015   CLONAZEPAM (KLONOPIN) 1 MG TABLET    Take 1 tablet (1 mg total) by mouth 2 (two) times daily as needed for anxiety.   ESCITALOPRAM (LEXAPRO) 10 MG TABLET    Take 1 tablet (10 mg total) by mouth daily. 1/2 for 7 days and then 1 a day   FLUTICASONE FUROATE-VILANTEROL 100-25 MCG/INH AEPB    One inhalation daily at bedtime.   LEVONORGESTREL-ETHINYL ESTRADIOL (JOLESSA) 0.15-0.03 MG TABLET    Take by  mouth.   PROPRANOLOL (INDERAL) 20 MG TABLET    Take 20 mg by mouth 2 (two) times daily.   SERTRALINE (ZOLOFT) 25 MG TABLET    Take 1 tablet (25 mg total) by mouth daily. Take 1/2 tablet daily for 6 days then 1 tablet daily for 6 days then 2 tablets daily.   VALACYCLOVIR (VALTREX) 500 MG TABLET        Review of Systems  Constitutional: Positive for chills, appetite change and fatigue. Negative for fever and diaphoresis.  HENT: Negative.   Respiratory: Positive for chest tightness. Negative for cough, shortness of breath and wheezing.   Cardiovascular: Positive for chest pain (sometimes ). Negative for palpitations.  Gastrointestinal: Positive for nausea, diarrhea (yesterday) and abdominal distention. Negative for vomiting, constipation and blood in stool.  Genitourinary: Negative.   Musculoskeletal: Negative.   Neurological: Negative.   Psychiatric/Behavioral: Negative.     Social History  Substance Use Topics  . Smoking status: Never Smoker   . Smokeless tobacco: Never Used  . Alcohol Use: 0.0 oz/week    0 Standard drinks or equivalent per week     Comment: occasional   Objective:   BP 110/78 mmHg  Pulse 93  Temp(Src) 97.1 F (36.2 C) (Oral)  Resp 16  Wt 125 lb 12.8 oz (57.063 kg)  SpO2 97%  LMP 06/18/2015 (Within Weeks)  Physical Exam  Constitutional: She  is oriented to person, place, and time. She appears well-developed and well-nourished. No distress.  Cardiovascular: Normal rate, regular rhythm and normal heart sounds.  Exam reveals no gallop and no friction rub.   No murmur heard. Pulmonary/Chest: Effort normal and breath sounds normal. No respiratory distress. She has no wheezes. She has no rales.  Abdominal: Soft. Normal appearance and bowel sounds are normal. She exhibits no distension, no ascites, no pulsatile midline mass and no mass. There is no hepatosplenomegaly. There is tenderness in the right upper quadrant. There is guarding (RUQ) and positive Murphy's sign.  There is no rigidity, no rebound, no CVA tenderness and no tenderness at McBurney's point.  Neurological: She is alert and oriented to person, place, and time.  Skin: Skin is warm and dry. She is not diaphoretic.        Assessment & Plan:     1. RUQ pain Will obtain right upper quadrant ultrasound to rule out cholecystitis or gallstones. She did have tenderness and guarding to palpation and positive Murphy sign. I will follow-up with her pending the results of the abdominal ultrasound. If positive we will refer to general surgery for further evaluation and treatment. If negative will consider adding Nexium for possible acid reflux and/or peptic ulcer as cause pain. I did also discuss with her and her mother that if all of this is negative that she could possibly be tested for POTS with a tilt table test as some of her other symptoms may correlate along with the nausea and upset stomach with this as well. I do want to rule out other causes first but this is also on the differential. - US Abdomen Limited RUQ; Future  2. Nausea See above medical treatment plan. - US Abdomen Limited RUQ; Future  3. Asthma, mild persistent, uncomplicated Possible side effects with Breo. We will discontinue. She states that she was given a sample of Aerospan a while back before going through her other inhalers and that one work the best for her. She states the only reason they never filled the prescription was because it was expensive. That was why they did a trial of other inhalers. I will refill the Aerospan as below. She is to call the office if she continues to have the increased heart rate, chest tightness and increased anxiety with the aerospan also. - Flunisolide HFA 80 MCG/ACT AERS; Inhale 1 puff into the lungs 2 (two) times daily.  Dispense: 8.9 g; Refill: 3  I did spend approximately 45 minutes with the patient and her mother counseling and answering questions. I also have reviewed previous labs and will be  reviewing a stat ultrasound report for her.      Margaretann Loveless, PA-C  [facility]

## 2015-09-08 NOTE — Patient Instructions (Signed)
Cholecystitis Cholecystitis is inflammation of the gallbladder. It is often called a gallbladder attack. The gallbladder is a pear-shaped organ that lies beneath the liver on the right side of the body. The gallbladder stores bile, which is a fluid that helps the body to digest fats. If bile builds up in your gallbladder, your gallbladder becomes inflamed. This condition may occur suddenly (be acute). Repeat episodes of acute cholecystitis or prolonged episodes may lead to a long-term (chronic) condition. Cholecystitis is serious and it requires treatment.  CAUSES The most common cause of this condition is gallstones. Gallstones can block the tube (duct) that carries bile out of your gallbladder. This causes bile to build up. Other causes of this condition include:  Damage to the gallbladder due to a decrease in blood flow.  Infections in the bile ducts.  Scars or kinks in the bile ducts.  Tumors in the liver, pancreas, or gallbladder. RISK FACTORS This condition is more likely to develop in:  People who have sickle cell disease.  People who take birth control pills or use estrogen.  People who have alcoholic liver disease.  People who have liver cirrhosis.  People who have their nutrition delivered through a vein (parenteral nutrition).  People who do not eat or drink (do fasting) for a long period of time.  People who are obese.  People who have rapid weight loss.  People who are pregnant.  People who have increased triglyceride levels.  People who have pancreatitis. SYMPTOMS Symptoms of this condition include:  Abdominal pain, especially in the upper right area of the abdomen.  Abdominal tenderness or bloating.  Nausea.  Vomiting.  Fever.  Chills.  Yellowing of the skin and the whites of the eyes (jaundice). DIAGNOSIS This condition is diagnosed with a medical history and physical exam. You may also have other tests, including:  Imaging tests, such as:  An  ultrasound of the gallbladder.  A CT scan of the abdomen.  A gallbladder nuclear scan (HIDA scan). This scan allows your health care provider to see the bile moving from your liver to your gallbladder and to your small intestine.  MRI.  Blood tests, such as:  A complete blood count, because the white blood cell count may be higher than normal.  Liver function tests, because some levels may be higher than normal with certain types of gallstones. TREATMENT Treatment may include:  Fasting for a certain amount of time.  IV fluids.  Medicine to treat pain or vomiting.  Antibiotic medicine.  Surgery to remove your gallbladder (cholecystectomy). This may happen immediately or at a later time. HOME CARE INSTRUCTIONS Home care will depend on your treatment. In general:  Take over-the-counter and prescription medicines only as told by your health care provider.  If you were prescribed an antibiotic medicine, take it as told by your health care provider. Do not stop taking the antibiotic even if you start to feel better.  Follow instructions from your health care provider about what to eat or drink. When you are allowed to eat, avoid eating or drinking anything that triggers your symptoms.  Keep all follow-up visits as told by your health care provider. This is important. SEEK MEDICAL CARE IF:  Your pain is not controlled with medicine.  You have a fever. SEEK IMMEDIATE MEDICAL CARE IF:  Your pain moves to another part of your abdomen or to your back.  You continue to have symptoms or you develop new symptoms even with treatment.   This information   is not intended to replace advice given to you by your health care provider. Make sure you discuss any questions you have with your health care provider.   Document Released: 07/16/2005 Document Revised: 04/06/2015 Document Reviewed: 10/27/2014 Elsevier Interactive Patient Education 2016 [employer] Inc[employer] Asthma, Adult Asthma is a  recurring condition in which the airways tighten and narrow. Asthma can make it difficult to breathe. It can cause coughing, wheezing, and shortness of breath. Asthma episodes, also called asthma attacks, range from minor to life-threatening. Asthma cannot be cured, but medicines and lifestyle changes can help control it. CAUSES Asthma is believed to be caused by inherited (genetic) and environmental factors, but its exact cause is unknown. Asthma may be triggered by allergens, lung infections, or irritants in the air. Asthma triggers are different for each person. Common triggers include:   Animal dander.  Dust mites.  Cockroaches.  Pollen from trees or grass.  Mold.  Smoke.  Air pollutants such as dust, household cleaners, hair sprays, aerosol sprays, paint fumes, strong chemicals, or strong odors.  Cold air, weather changes, and winds (which increase molds and pollens in the air).  Strong emotional expressions such as crying or laughing hard.  Stress.  Certain medicines (such as aspirin) or types of drugs (such as beta-blockers).  Sulfites in foods and drinks. Foods and drinks that may contain sulfites include dried fruit, potato chips, and sparkling grape juice.  Infections or inflammatory conditions such as the flu, a cold, or an inflammation of the nasal membranes (rhinitis).  Gastroesophageal reflux disease (GERD).  Exercise or strenuous activity. SYMPTOMS Symptoms may occur immediately after asthma is triggered or many hours later. Symptoms include:  Wheezing.  Excessive nighttime or early morning coughing.  Frequent or severe coughing with a common cold.  Chest tightness.  Shortness of breath. DIAGNOSIS  The diagnosis of asthma is made by a review of your medical history and a physical exam. Tests may also be performed. These may include:  Lung function studies. These tests show how much air you breathe in and out.  Allergy tests.  Imaging tests such as  X-rays. TREATMENT  Asthma cannot be cured, but it can usually be controlled. Treatment involves identifying and avoiding your asthma triggers. It also involves medicines. There are 2 classes of medicine used for asthma treatment:   Controller medicines. These prevent asthma symptoms from occurring. They are usually taken every day.  Reliever or rescue medicines. These quickly relieve asthma symptoms. They are used as needed and provide short-term relief. Your health care provider will help you create an asthma action plan. An asthma action plan is a written plan for managing and treating your asthma attacks. It includes a list of your asthma triggers and how they may be avoided. It also includes information on when medicines should be taken and when their dosage should be changed. An action plan may also involve the use of a device called a peak flow meter. A peak flow meter measures how well the lungs are working. It helps you monitor your condition. HOME CARE INSTRUCTIONS   Take medicines only as directed by your health care provider. Speak with your health care provider if you have questions about how or when to take the medicines.  Use a peak flow meter as directed by your health care provider. Record and keep track of readings.  Understand and use the action plan to help minimize or stop an asthma attack without needing to seek medical care.  Control your home  environment in the following ways to help prevent asthma attacks:  Do not smoke. Avoid being exposed to secondhand smoke.  Change your heating and air conditioning filter regularly.  Limit your use of fireplaces and wood stoves.  Get rid of pests (such as roaches and mice) and their droppings.  Throw away plants if you see mold on them.  Clean your floors and dust regularly. Use unscented cleaning products.  Try to have someone else vacuum for you regularly. Stay out of rooms while they are being vacuumed and for a short  while afterward. If you vacuum, use a dust mask from a hardware store, a double-layered or microfilter vacuum cleaner bag, or a vacuum cleaner with a HEPA filter.  Replace carpet with wood, tile, or vinyl flooring. Carpet can trap dander and dust.  Use allergy-proof pillows, mattress covers, and box spring covers.  Wash bed sheets and blankets every week in hot water and dry them in a dryer.  Use blankets that are made of polyester or cotton.  Clean bathrooms and kitchens with bleach. If possible, have someone repaint the walls in these rooms with mold-resistant paint. Keep out of the rooms that are being cleaned and painted.  Wash hands frequently. SEEK MEDICAL CARE IF:   You have wheezing, shortness of breath, or a cough even if taking medicine to prevent attacks.  The colored mucus you cough up (sputum) is thicker than usual.  Your sputum changes from clear or white to yellow, green, gray, or bloody.  You have any problems that may be related to the medicines you are taking (such as a rash, itching, swelling, or trouble breathing).  You are using a reliever medicine more than 2-3 times per week.  Your peak flow is still at 50-79% of your personal best after following your action plan for 1 hour.  You have a fever. SEEK IMMEDIATE MEDICAL CARE IF:   You seem to be getting worse and are unresponsive to treatment during an asthma attack.  You are short of breath even at rest.  You get short of breath when doing very little physical activity.  You have difficulty eating, drinking, or talking due to asthma symptoms.  You develop chest pain.  You develop a fast heartbeat.  You have a bluish color to your lips or fingernails.  You are light-headed, dizzy, or faint.  Your peak flow is less than 50% of your personal best.   This information is not intended to replace advice given to you by your health care provider. Make sure you discuss any questions you have with your  health care provider.   Document Released: 07/16/2005 Document Revised: 04/06/2015 Document Reviewed: 02/12/2013 Elsevier Interactive Patient Education [employer]

## 2015-09-09 ENCOUNTER — Ambulatory Visit: Payer: 59 | Admitting: Family Medicine

## 2015-09-09 ENCOUNTER — Telehealth: Payer: Self-pay | Admitting: Family Medicine

## 2015-09-09 ENCOUNTER — Telehealth: Payer: Self-pay

## 2015-09-09 NOTE — Telephone Encounter (Signed)
Please review. Thanks!  

## 2015-09-09 NOTE — Telephone Encounter (Signed)
Mother Advised as directed below. Told her you will call her back.  Thanks,  -Jahna Liebert

## 2015-09-09 NOTE — Telephone Encounter (Signed)
Antony Contras spoke with patient regarding the further plans.  She is going to start prilosec for 2 weeks to see if there is improvement in nausea and stomach pain and call if no improvements.  Thanks,  -Jermichael Belmares

## 2015-09-09 NOTE — Telephone Encounter (Signed)
Thanks for the FYI. Hopefully she will call back.  Thanks.

## 2015-09-09 NOTE — Telephone Encounter (Signed)
Referral made to [facility].They have made 3 attempts to contact pt to set up appointment.I also left message for pt to return call today.

## 2015-09-09 NOTE — Telephone Encounter (Signed)
-----   Message from Margaretann Loveless, PA-C sent at 09/09/2015  9:28 AM EST ----- Normal [place]. Left VM for callback to discuss plan.

## 2015-09-13 ENCOUNTER — Telehealth: Payer: Self-pay | Admitting: Family Medicine

## 2015-09-13 NOTE — Telephone Encounter (Signed)
Pt's mom Misty Stanley called to cancel the f/u appt for Friday 09/16/15. Misty Stanley wanted to let Dr. Elease Hashimoto know that she canceled the appt b/c pt has appt with Dr. Daleen Bo on 10/06/15 @ 2 pm. Per Misty Stanley pt has stopped taking the medications that Dr. Elease Hashimoto advised her to stop and Misty Stanley doesn't feel that pt needs the appt. Misty Stanley stated that Dr. Elease Hashimoto could call her or pt if needed. Thanks TNP

## 2015-09-16 ENCOUNTER — Ambulatory Visit: Payer: 59 | Admitting: Family Medicine

## 2015-10-06 ENCOUNTER — Ambulatory Visit (INDEPENDENT_AMBULATORY_CARE_PROVIDER_SITE_OTHER): Payer: 59 | Admitting: Psychiatry

## 2015-10-06 ENCOUNTER — Encounter: Payer: Self-pay | Admitting: Psychiatry

## 2015-10-06 DIAGNOSIS — F419 Anxiety disorder, unspecified: Secondary | ICD-10-CM | POA: Diagnosis not present

## 2015-10-06 MED ORDER — BUPROPION HCL 75 MG PO TABS: 37.5000 mg | ORAL_TABLET | Freq: Every morning | ORAL | Status: DC

## 2015-10-06 NOTE — Progress Notes (Signed)
Psychiatric Initial Adult Assessment   Patient Identification: DEAISA MERIDA MRN:  [id number] Date of Evaluation:  10/06/2015 Referral Source:  Chief Complaint:   Chief Complaint    Anxiety; Panic Attack     Visit Diagnosis: GAD Diagnosis:   Patient Active Problem List   Diagnosis Date Noted  . Anxiety [F41.9] 08/18/2015  . Asthma [J45.909] 08/18/2015  . 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 Allergic rhinitis, seasonal [J30.2] 02/16/2015  . 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 Reports that she was previously seeing Dr. Evelene Croon but she no longer accepts that insurance and they're here to establish care. Patient reports that she started developing anxiety in her junior year of high school and that turn into panic attacks in her remaining years of school. Currently patient is not in school and is trying to get back to work. She works in [employer]. She has been prescribed Klonopin 1 mg twice daily by her previous psychiatrist and states that that helps with her panic attacks. She reports good sleep and appetite in general. She denies any depressed mood. She denies history of abuse or trauma.  Patient states that when she has the panic attack she feels like she cannot breathe and feels like a child is sitting on her chest. She reports having anxiety without any specific triggers. States that she is worried that something bad may happen without any triggers. States that she started feeling like that in classes" and stopped going to school but would like to start taking classes in the future. Currently she is living on her own with her boyfriend. Patient also reports that she is been smoking marijuana daily since she was 13. However she reports that  she is not addicted and has given it up many times. Discussed with her that marijuana  long-term use at her age could lead to psychiatric symptoms including anxiety and panic symptoms. Discussed with her that we would start her on medication but she would need to stop smoking the marijuana. Patient states that she is not willing to do that and feels like the marijuana helps with her anxiety. Patient was previously started on Zoloft at a low dose and stated that it just made her like a zombie. Mom agrees with this. Patient has never been hospitalized psychiatrically. She denies any suicidal symptoms. She denies any excessive drinking.  Associated Signs/Symptoms: Depression Symptoms:  denies (Hypo) Manic Symptoms:  denies Anxiety Symptoms:  Excessive Worry, Psychotic Symptoms:  denies PTSD Symptoms: Negative  Past Medical History:  Past Medical History  Diagnosis Date  . Allergic rhinitis     seasonal  . Insomnia   . Panic disorder     Past Surgical History  Procedure Laterality Date  . Tonsillectomy  2008   Family History:  Family History  Problem Relation Age of Onset  . Depression Mother   . Mental illness Mother   . Irritable bowel syndrome Mother   . Colon polyps Mother   . Bipolar disorder Sister   . Mental illness Sister   . Seizures Sister   . Cancer Maternal Grandmother     colon  . Alzheimer's disease Maternal Grandmother   . Alzheimer's disease Maternal Grandfather    Social History:   Social History   Social History  . Marital Status: Single  Spouse Name: N/A  . Number of Children: N/A  . Years of Education: N/A   Social History Main Topics  . Smoking status: Never Smoker   . Smokeless tobacco: Never Used  . Alcohol Use: 0.0 oz/week    0 Standard drinks or equivalent per week     Comment: occasional  . Drug Use: Yes    Special: Marijuana  . Sexual Activity: Yes    Birth Control/ Protection: Pill   Other Topics Concern  . Not on file   Social  History Narrative   Additional Social History: Patient is a 20 year old Caucasian female who is currently not in school and is working who presents with anxiety symptoms. Patient is living with her boyfriend currently.  Musculoskeletal: Strength & Muscle Tone: within normal limits Gait & Station: normal Patient leans: N/A  Psychiatric Specialty Exam: HPI  ROS  There were no vitals taken for this visit.There is no weight on file to calculate BMI.  General Appearance: Casual  Eye Contact:  Fair  Speech:  Clear and Coherent  Volume:  Normal  Mood:  Anxious  Affect:  Constricted  Thought Process:  Coherent  Orientation:  Full (Time, Place, and Person)  Thought Content:  WDL  Suicidal Thoughts:  No  Homicidal Thoughts:  No  Memory:  Immediate;   Fair Recent;   Fair Remote;   Fair  Judgement:  Intact  Insight:  Present  Psychomotor Activity:  Normal  Concentration:  Fair  Recall:  [employer] of Knowledge:Fair  Language: Fair  Akathisia:  No  Handed:  Right  AIMS (if indicated):  na  Assets:  Communication Skills Desire for Improvement Financial Resources/Insurance Housing Physical Health Resilience  ADL's:  Intact  Cognition: WNL  Sleep:  good   Is the patient at risk to self?  No. Has the patient been a risk to self in the past 6 months?  No. Has the patient been a risk to self within the distant past?  No. Is the patient a risk to others?  No. Has the patient been a risk to others in the past 6 months?  No. Has the patient been a risk to others within the distant past?  No.  Allergies:  No Known Allergies Current Medications: Current Outpatient Prescriptions  Medication Sig Dispense Refill  . albuterol (PROVENTIL HFA;VENTOLIN HFA) 108 (90 Base) MCG/ACT inhaler Inhale 2 puffs into the lungs every 6 (six) hours as needed for wheezing or shortness of breath. 1 Inhaler 2  . BENZEPRO SHORT CONTACT 9.8 % FOAM Reported on 09/08/2015    . clonazePAM (KLONOPIN) 1 MG  tablet Take 1 tablet (1 mg total) by mouth 2 (two) times daily as needed for anxiety. 30 tablet 0  . Flunisolide HFA 80 MCG/ACT AERS Inhale 1 puff into the lungs 2 (two) times daily. 8.9 g 3  . levonorgestrel-ethinyl estradiol (JOLESSA) 0.15-0.03 MG tablet Take by mouth.    . propranolol (INDERAL) 20 MG tablet Take 20 mg by mouth 2 (two) times daily.  1  . valACYclovir (VALTREX) 500 MG tablet      No current facility-administered medications for this visit.    Previous Psychotropic Medications: Yes   Substance Abuse History in the last 12 months:  Yes.  Patient has been smoking marijuana regularly since she was age 57. States that she smokes on a daily basis for the most part.  Consequences of Substance Abuse: Negative  Medical Decision Making:  Review of Psycho-Social Stressors (1), Review or  order clinical lab tests (1), Review and summation of old records (2), Established Problem, Worsening (2), Review of Medication Regimen & Side Effects (2) and Review of New Medication or Change in Dosage (2)  Treatment Plan Summary: Medication management   Anxiety Start bupropion at 37.5 mg by mouth every morning. Start to see a therapist for cognitive behavioral therapy. Patient not interested in seeing a therapist at this time.  Panic attacks Same as above Continue Klonopin but decrease the dose 2.5 mg as needed for panic attacks. Patient has 9 tablets left from her previous prescription.  Return to clinic in 3 weeks time or call before if necessary    Tierre Netto 3/9/20171:50 PM

## 2015-10-27 ENCOUNTER — Ambulatory Visit: Payer: 59 | Admitting: Psychiatry

## 2015-10-28 ENCOUNTER — Telehealth: Payer: Self-pay | Admitting: Family Medicine

## 2015-10-28 NOTE — Telephone Encounter (Signed)
Pt's mother Misty StanleyLisa called stating that [facility] and does not want to go back to see her.Can you put in new order for psychiatry ? Call back # [phone]

## 2015-10-28 NOTE — Telephone Encounter (Signed)
Tried calling patient's mother and mailbox was full. Will try again later.

## 2015-10-28 NOTE — Telephone Encounter (Signed)
Ok to put in order. Do they know who they want to see? Thanks.

## 2015-10-31 ENCOUNTER — Telehealth: Payer: Self-pay | Admitting: Family Medicine

## 2015-10-31 DIAGNOSIS — F419 Anxiety disorder, unspecified: Secondary | ICD-10-CM

## 2015-10-31 NOTE — Telephone Encounter (Signed)
Pt's mother would like referral to [facility] on [address] [place]

## 2015-11-01 NOTE — Telephone Encounter (Signed)
Referral order placed. Allene DillonEmily Drozdowski, CMA

## 2015-11-01 NOTE — Telephone Encounter (Signed)
Yes., Please

## 2015-11-01 NOTE — Telephone Encounter (Signed)
Do I need to put order in? Please advise. Thanks!

## 2015-11-01 NOTE — Telephone Encounter (Signed)
Ok to refer. Thanks

## 2015-12-29 ENCOUNTER — Telehealth: Payer: Self-pay

## 2015-12-29 ENCOUNTER — Encounter: Payer: Self-pay | Admitting: Family Medicine

## 2015-12-29 ENCOUNTER — Ambulatory Visit (INDEPENDENT_AMBULATORY_CARE_PROVIDER_SITE_OTHER): Payer: 59 | Admitting: Family Medicine

## 2015-12-29 VITALS — BP 122/60 | HR 72 | Temp 98.5°F | Resp 16 | Wt 124.0 lb

## 2015-12-29 DIAGNOSIS — S80211A Abrasion, right knee, initial encounter: Secondary | ICD-10-CM

## 2015-12-29 DIAGNOSIS — S93401A Sprain of unspecified ligament of right ankle, initial encounter: Secondary | ICD-10-CM

## 2015-12-29 DIAGNOSIS — S8001XA Contusion of right knee, initial encounter: Secondary | ICD-10-CM | POA: Diagnosis not present

## 2015-12-29 DIAGNOSIS — S90511A Abrasion, right ankle, initial encounter: Secondary | ICD-10-CM | POA: Diagnosis not present

## 2015-12-29 NOTE — Telephone Encounter (Signed)
Patient's mother called saying that the patient was skateboarding and fell. She reports that the patient has abrasions on her elbows, knees, and her right ankle is starting to swell. She reports that the patient is in a lot of pain, and requested to be seen today. Alden BenjaminAdvised Bob, and he recommended that patient use the RICE protocol and see if this improve the patient's symptoms. Patient's mother verbalized understanding. Advised that if the patient continues to have swelling and pain, call tomorrow and schedule an OV. Patient's mom verbalized understanding and agrees to treatment plan.

## 2015-12-29 NOTE — Progress Notes (Signed)
Subjective:     Patient ID: Rachel Mercer, female   DOB: [birth year], 20 y.o.   MRN: [id number]  HPI  Chief Complaint  Patient presents with  . Fall    Patient was skateboarding on Monday, 5/29 and fell and hurt her right knee and right ankle. Patient reports that she has had swelling in her ankle. Patient has not been taking anything OTC for symptoms.   She sustained abrasion to her right knee, right ankle and wrist. States it is primarily her right ankle which is bothering her today with weight bearing. Has been taking ibuprofen for her sx. Accompanied by her mother today.    Review of Systems     Objective:   Physical Exam  Constitutional: She appears well-developed and well-nourished. She appears distressed (mild with antalgic gait).  Musculoskeletal:  Right ankle ligaments stable. Mild tenderness over her lateral malleolus with minimal swelling. Ligaments stable with increased pain on inversion. Right knee with FROM, knee ligaments stable. No patellar tenderness.  Skin:  Abrasion with eschar on her right knee and dorsum of right ankle  with underlying swelling.       Assessment:    1. Knee abrasion, right, initial encounter  2. Knee contusion, right, initial encounter  3. Ankle sprain, right, initial encounter  4. Ankle abrasion, right, initial encounter    Plan:    Discussed application of Normal saline solution and bandaid. RICE for her ankle (they have purchased ACE wraps). Work excuse for 6/2-6/3. Encouraged use of crutches until no further pain on w.b.

## 2015-12-29 NOTE — Patient Instructions (Addendum)
Discussed use of ACE wrap to ankle while up. Continue elevation with work excuse provided. Apply salt water solution (normal saline) to your abrasions daily and apply bandaid. Use crutches until it does not to hurt to weight bear.

## 2016-02-13 ENCOUNTER — Other Ambulatory Visit: Payer: Self-pay | Admitting: Family Medicine

## 2016-02-13 ENCOUNTER — Ambulatory Visit
Admission: RE | Admit: 2016-02-13 | Discharge: 2016-02-13 | Disposition: A | Discharge status: Home / Self Care | Payer: 59 | Source: Ambulatory Visit | Attending: Family Medicine | Admitting: Family Medicine

## 2016-02-13 DIAGNOSIS — R102 Pelvic and perineal pain: Secondary | ICD-10-CM

## 2016-02-13 DIAGNOSIS — N949 Unspecified condition associated with female genital organs and menstrual cycle: Secondary | ICD-10-CM | POA: Diagnosis not present

## 2016-02-16 ENCOUNTER — Encounter: Payer: Self-pay | Admitting: Emergency Medicine

## 2016-02-16 ENCOUNTER — Emergency Department
Admission: EM | Admit: 2016-02-16 | Discharge: 2016-02-16 | Disposition: A | Discharge status: Home / Self Care | Payer: 59 | Attending: Emergency Medicine | Admitting: Emergency Medicine

## 2016-02-16 DIAGNOSIS — R1030 Lower abdominal pain, unspecified: Secondary | ICD-10-CM | POA: Diagnosis present

## 2016-02-16 DIAGNOSIS — J45909 Unspecified asthma, uncomplicated: Secondary | ICD-10-CM | POA: Diagnosis not present

## 2016-02-16 DIAGNOSIS — R109 Unspecified abdominal pain: Secondary | ICD-10-CM

## 2016-02-16 LAB — URINALYSIS COMPLETE WITH MICROSCOPIC (ARMC ONLY)
Bacteria, UA: NONE SEEN
Bilirubin Urine: NEGATIVE
Glucose, UA: NEGATIVE mg/dL
Hgb urine dipstick: NEGATIVE
Leukocytes, UA: NEGATIVE
Nitrite: NEGATIVE
PROTEIN: NEGATIVE mg/dL
Specific Gravity, Urine: 1.023 (ref 1.005–1.030)
pH: 5 (ref 5.0–8.0)

## 2016-02-16 LAB — COMPREHENSIVE METABOLIC PANEL
ALBUMIN: 4.7 g/dL (ref 3.5–5.0)
ALK PHOS: 45 U/L (ref 38–126)
ALT: 33 U/L (ref 14–54)
AST: 32 U/L (ref 15–41)
Anion gap: 7 (ref 5–15)
BUN: 11 mg/dL (ref 6–20)
CALCIUM: 9.7 mg/dL (ref 8.9–10.3)
CO2: 24 mmol/L (ref 22–32)
CREATININE: 0.85 mg/dL (ref 0.44–1.00)
Chloride: 104 mmol/L (ref 101–111)
GFR calc Af Amer: >60 mL/min (ref >60)
GFR calc non Af Amer: >60 mL/min (ref >60)
GLUCOSE: 98 mg/dL (ref 65–99)
Potassium: 4.4 mmol/L (ref 3.5–5.1)
SODIUM: 135 mmol/L (ref 135–145)
Total Bilirubin: 0.8 mg/dL (ref 0.3–1.2)
Total Protein: 8.2 g/dL — ABNORMAL HIGH (ref 6.5–8.1)

## 2016-02-16 LAB — CBC
HCT: 47.3 % — ABNORMAL HIGH (ref 35.0–47.0)
HEMOGLOBIN: 16.3 g/dL — AB (ref 12.0–16.0)
MCH: 33.1 pg (ref 26.0–34.0)
MCHC: 34.4 g/dL (ref 32.0–36.0)
MCV: 96.3 fL (ref 80.0–100.0)
PLATELETS: 288 10*3/uL (ref 150–440)
RBC: 4.91 MIL/uL (ref 3.80–5.20)
RDW: 12.4 % (ref 11.5–14.5)
WBC: 10.2 10*3/uL (ref 3.6–11.0)

## 2016-02-16 LAB — POCT PREGNANCY, URINE: Preg Test, Ur: NEGATIVE

## 2016-02-16 LAB — LIPASE, BLOOD: Lipase: 24 U/L (ref 11–51)

## 2016-02-16 NOTE — ED Notes (Signed)
Pt presents with lower abdominal pain.  Seen by GYN on Tuesday. Patient has had no pain since last night.

## 2016-02-16 NOTE — ED Notes (Signed)
C/O mid abdominal and lower abdominal pain.  Also states had heavy vaginal bleeding that started last Thursday after inter coarse, but has stopped today.    Seen through walk in clinic on Monday and lab work done and ultrasound done.  Seen by GYN on Tuesday.  Patient initially felt better but then pain returned last night.  LMP:  12/17/15

## 2016-02-16 NOTE — ED Provider Notes (Signed)
[facility] Emergency Department Provider Note   ____________________________________________  Time seen: Approximately 3:49 PM  I have reviewed the triage vital signs and the nursing notes.   HISTORY  Chief Complaint Abdominal Pain and Pelvic Pain    HPI Rachel Mercer is a 20 y.o. female who reports she's been having some lower abdominal crampy pain. She went to see walk-in clinic and had basically normal examof her motion tenderness normal lab work SHOWING SOME SMALL AMOUNT OF FREE FLUID WITH DEBRIS BUT NOTHING MUCH THEN SHE WENT TO [facility] AND GOT SOME ANTIBIOTIC SHE SAID REALLY DIDN'T TALK MUCH TO THE DOCTOR AND WENT HOME TODAY SHE SHOULD SAY LAST NIGHT SHE HAD SOME CRAMPY LOWER ABDOMINAL PAIN IS FAIRLY SEVERE BUT TODAY IT'S GONE CAME TO THE ER FOR RECHECK. SHE HAS NO PAIN NOW I DISCUSSED HER CASE WITH DR. Jean RosenthalJACKSON WHO FOUND THE PATIENT'S NAME AND THE PATIENT LIVES AT [facility] BUT THERE WAS NO NOTE ATTACHED TO IT HE REALLY IS UNSURE WHAT TO DO WITH THE PATIENT NEXT. THE ONLY OTHER THING THE PATIENT REPORTS IS AT THE LAST 3 TIME SHE IS HAD A BOWEL MOVEMENT SHE HAD SOME BLOOD COME OUT OF HER VAGINA. SHE IS ON A DAILY BIRTH CONTROL PILL WHICH APPARENTLY GIVES HER A MENSTRUAL PERIOD ONCE EVERY 3 MONTHS. IT IS NOT TIME FOR [facility]. AT PRESENT. I WILL HAVE HER FOLLOW UP WITH [facility] NEXT WEEK TUESDAY OR WEDNESDAY. SHE CAN RETURN HERE IF SHE IS WORSE.   Past Medical History  Diagnosis Date  . Allergic rhinitis     seasonal  . Insomnia   . Panic disorder     Patient Active Problem List   Diagnosis Date Noted  . Anxiety 08/18/2015  . Asthma 08/18/2015  . 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 Allergic rhinitis, seasonal 02/16/2015  . 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 Tonsillectomy  2008  . Appendectomy      Current Outpatient Rx  Name  Route  Sig  Dispense  Refill  .  albuterol (PROVENTIL HFA;VENTOLIN HFA) 108 (90 Base) MCG/ACT inhaler   Inhalation   Inhale 2 puffs into the lungs every 6 (six) hours as needed for wheezing or shortness of breath.   1 Inhaler   2   . BENZEPRO SHORT CONTACT 9.8 % FOAM      Reported on 12/29/2015           Dispense as written.   Marland Kitchen. buPROPion (WELLBUTRIN) 75 MG tablet   Oral   Take 0.5 tablets (37.5 mg total) by mouth every morning. Patient not taking: Reported on 12/29/2015   30 tablet   0   . clonazePAM (KLONOPIN) 1 MG tablet   Oral   Take 1 tablet (1 mg total) by mouth 2 (two) times daily as needed for anxiety.   30 tablet   0   . Flunisolide HFA 80 MCG/ACT AERS   Inhalation   Inhale 1 puff into the lungs 2 (two) times daily.   8.9 g   3   . levonorgestrel-ethinyl estradiol (JOLESSA) 0.15-0.03 MG tablet   Oral   Take by mouth.         . propranolol (INDERAL) 20 MG tablet   Oral   Take 20 mg by mouth 2 (two) times daily.      1   . valACYclovir (VALTREX) 500 MG tablet  Allergies Review of patient's allergies indicates no known allergies.  Family History  Problem Relation Age of Onset  . Depression Mother   . Mental illness Mother   . Irritable bowel syndrome Mother   . Colon polyps Mother   . Bipolar disorder Sister   . Mental illness Sister   . Seizures Sister   . Depression Sister   . Cancer Maternal Grandmother     colon  . Alzheimer's disease Maternal Grandmother   . Alzheimer's disease Maternal Grandfather   . Anxiety disorder Brother     Social History Social History  Substance Use Topics  . Smoking status: Never Smoker   . Smokeless tobacco: Never Used  . Alcohol Use: 0.0 - 1.8 oz/week    0 Standard drinks or equivalent, 0-3 Cans of beer per week     Comment: occasional    Review of Systems Constitutional: No fever/chills Eyes: No visual changes. ENT: No sore throat. Cardiovascular: Denies chest pain. Respiratory: Denies shortness of  breath. Gastrointestinal: No abdominal pain.  No nausea, no vomiting.  No diarrhea.  No constipation. Genitourinary: Negative for dysuria. Musculoskeletal: Negative for back pain. Skin: Negative for rash. Neurological: Negative for headaches, focal weakness or numbness.  10-point ROS otherwise negative.  ____________________________________________   PHYSICAL EXAM:  VITAL SIGNS: ED Triage Vitals  Enc Vitals Group     BP 02/16/16 1401 110/77 mmHg     Pulse Rate 02/16/16 1401 95     Resp 02/16/16 1401 16     Temp 02/16/16 1401 98.9 F (37.2 C)     Temp Source 02/16/16 1401 Oral     SpO2 02/16/16 1401 98 %     Weight 02/16/16 1401 125 lb (56.7 kg)     Height 02/16/16 1401  (1.651 m)     Head Cir --      Peak Flow --      Pain Score 02/16/16 1402 0     Pain Loc --      Pain Edu? --      Excl. in GC? --     Constitutional: Alert and oriented. Well appearing and in no acute distress. Eyes: Conjunctivae are normal. PERRL. EOMI. Head: Atraumatic. Nose: No congestion/rhinnorhea. Mouth/Throat: Mucous membranes are moist.  Oropharynx non-erythematous. Neck: No stridor.  Cardiovascular: Normal rate, regular rhythm. Grossly normal heart sounds.  Good peripheral circulation. Respiratory: Normal respiratory effort.  No retractions. Lungs CTAB. Gastrointestinal: Soft and nontender. No distention. No abdominal bruits. No CVA tenderness. Musculoskeletal: No lower extremity tenderness nor edema.  No joint effusions. Neurologic:  Normal speech and language. No gross focal neurologic deficits are appreciated. No gait instability. Skin:  Skin is warm, dry and intact. No rash noted. Psychiatric: Mood and affect are normal. Speech and behavior are normal.  ____________________________________________   LABS (all labs ordered are listed, but only abnormal results are displayed)  Labs Reviewed  COMPREHENSIVE METABOLIC PANEL - Abnormal; Notable for the following:    Total Protein 8.2  (*)    All other components within normal limits  CBC - Abnormal; Notable for the following:    Hemoglobin 16.3 (*)    HCT 47.3 (*)    All other components within normal limits  URINALYSIS COMPLETEWITH MICROSCOPIC ([facility] ONLY) - Abnormal; Notable for the following:    Color, Urine YELLOW (*)    APPearance CLEAR (*)    Ketones, ur TRACE (*)    Squamous Epithelial / LPF 0-5 (*)    All other components within  normal limits  LIPASE, BLOOD  POC URINE PREG, ED  POCT PREGNANCY, URINE   ____________________________________________  EKG   ____________________________________________  RADIOLOGY   ____________________________________________   PROCEDURES    Procedures    ____________________________________________   INITIAL IMPRESSION / ASSESSMENT AND PLAN / ED COURSE  Pertinent labs & imaging results that were available during my care of the patient were reviewed by me and considered in my medical decision making (see chart for details).  Patient reports she's having normal well-formed stool she had 2 today. I do not think it is necessary to repeat the ultrasound or do the CT scan read and a KUB at this point she has absolutely no abdominal pain or masses on exam. No abdominal pain even to deep percussion or palpation. ____________________________________________   FINAL CLINICAL IMPRESSION(S) / ED DIAGNOSES  Final diagnoses:  Abdominal pain, unspecified abdominal location      NEW MEDICATIONS STARTED DURING THIS VISIT:  New Prescriptions   No medications on file     Note:  This document was prepared using Dragon voice recognition software and may include unintentional dictation errors.    Arnaldo Natal, MD 02/16/16 [phone]

## 2016-02-16 NOTE — Discharge Instructions (Signed)
Abdominal Pain, Adult Many things can cause abdominal pain. Usually, abdominal pain is not caused by a disease and will improve without treatment. It can often be observed and treated at home. Your health care provider will do a physical exam and possibly order blood tests and X-rays to help determine the seriousness of your pain. However, in many cases, more time must pass before a clear cause of the pain can be found. Before that point, your health care provider may not know if you need more testing or further treatment. HOME CARE INSTRUCTIONS Monitor your abdominal pain for any changes. The following actions may help to alleviate any discomfort you are experiencing:  Only take over-the-counter or prescription medicines as directed by your health care provider.  Do not take laxatives unless directed to do so by your health care provider.  Try a clear liquid diet (broth, tea, or water) as directed by your health care provider. Slowly move to a bland diet as tolerated. SEEK MEDICAL CARE IF:  You have unexplained abdominal pain.  You have abdominal pain associated with nausea or diarrhea.  You have pain when you urinate or have a bowel movement.  You experience abdominal pain that wakes you in the night.  You have abdominal pain that is worsened or improved by eating food.  You have abdominal pain that is worsened with eating fatty foods.  You have a fever. SEEK IMMEDIATE MEDICAL CARE IF:  Your pain does not go away within 2 hours.  You keep throwing up (vomiting).  Your pain is felt only in portions of the abdomen, such as the right side or the left lower portion of the abdomen.  You pass bloody or black tarry stools. MAKE SURE YOU:  Understand these instructions.  Will watch your condition.  Will get help right away if you are not doing well or get worse.   This information is not intended to replace advice given to you by your health care provider. Make sure you discuss  any questions you have with your health care provider.   Document Released: 04/25/2005 Document Revised: 04/06/2015 Document Reviewed: 03/25/2013 Elsevier Interactive Patient Education [employer]2016 [employer]   Please return if you're worse. [facility] next week unless you stay completely well.

## 2016-04-09 ENCOUNTER — Ambulatory Visit: Payer: Self-pay | Admitting: Family Medicine

## 2016-04-10 ENCOUNTER — Ambulatory Visit: Payer: Self-pay | Admitting: Family Medicine

## 2016-04-10 ENCOUNTER — Ambulatory Visit (INDEPENDENT_AMBULATORY_CARE_PROVIDER_SITE_OTHER): Payer: 59 | Admitting: Family Medicine

## 2016-04-10 ENCOUNTER — Encounter: Payer: Self-pay | Admitting: Family Medicine

## 2016-04-10 VITALS — BP 110/76 | HR 84 | Temp 98.5°F | Resp 14 | Wt 127.2 lb

## 2016-04-10 DIAGNOSIS — F32A Depression, unspecified: Secondary | ICD-10-CM

## 2016-04-10 DIAGNOSIS — F329 Major depressive disorder, single episode, unspecified: Secondary | ICD-10-CM | POA: Diagnosis not present

## 2016-04-10 MED ORDER — VENLAFAXINE HCL ER 37.5 MG PO CP24: 37.5000 mg | ORAL_CAPSULE | Freq: Every day | ORAL | 0 refills | Status: DC

## 2016-04-10 NOTE — Progress Notes (Signed)
Patient: Rachel Mercer Female    DOB: [birth year]   20 y.o.   MRN: [id number] Visit Date: 04/10/2016  Today's Provider: Dortha Kern, PA   Chief Complaint  Patient presents with  . Depression   Subjective:    Depression         This is a new problem.  The current episode started more than 1 month ago.   The onset quality is gradual.   The problem occurs intermittently.  The problem has been gradually worsening since onset.  Associated symptoms include hopelessness, irritable, decreased interest, appetite change, sad and suicidal ideas (pt states she has days that she wished she didnt wake up).     Exacerbated by: PMH- anxiety, grandmother recently passed, patient in and out of hospital. patient reports she hates her job. money issues with boyfriend.  Past medical history includes anxiety.   Patient has a family history of depression.   Past Medical History:  Diagnosis Date  . Allergic rhinitis    seasonal  . Insomnia   . Panic disorder    Past Surgical History:  Procedure Laterality Date  . APPENDECTOMY    . TONSILLECTOMY  2008   Family History  Problem Relation Age of Onset  . Depression Mother   . Mental illness Mother   . Irritable bowel syndrome Mother   . Colon polyps Mother   . Bipolar disorder Sister   . Mental illness Sister   . Seizures Sister   . Depression Sister   . Cancer Maternal Grandmother     colon  . Alzheimer's disease Maternal Grandmother   . Alzheimer's disease Maternal Grandfather   . Anxiety disorder Brother    No Known Allergies   Previous Medications   ALBUTEROL (PROVENTIL HFA;VENTOLIN HFA) 108 (90 BASE) MCG/ACT INHALER    Inhale 2 puffs into the lungs every 6 (six) hours as needed for wheezing or shortness of breath.   BENZEPRO SHORT CONTACT 9.8 % FOAM    Reported on 12/29/2015   CLONAZEPAM (KLONOPIN) 1 MG TABLET    Take 1 tablet (1 mg total) by mouth 2 (two) times daily as needed for anxiety.   FLUNISOLIDE HFA 80 MCG/ACT AERS    Inhale  1 puff into the lungs 2 (two) times daily.   LEVONORGESTREL-ETHINYL ESTRADIOL (JOLESSA) 0.15-0.03 MG TABLET    Take by mouth.   PROPRANOLOL (INDERAL) 20 MG TABLET    Take 20 mg by mouth 2 (two) times daily.   VALACYCLOVIR (VALTREX) 500 MG TABLET        Review of Systems  Constitutional: Positive for appetite change.  Respiratory: Negative.   Cardiovascular: Negative.   Psychiatric/Behavioral: Positive for depression, dysphoric mood and suicidal ideas (pt states she has days that she wished she didnt wake up). The patient is nervous/anxious.     Social History  Substance Use Topics  . Smoking status: Never Smoker  . Smokeless tobacco: Never Used  . Alcohol use 0.0 - 1.8 oz/week     Comment: occasional   Objective:   BP 110/76 (BP Location: Right Arm, Patient Position: Sitting, Cuff Size: Normal)   Pulse 84   Temp 98.5 F (36.9 C) (Oral)   Resp 14   Wt 127 lb 3.2 oz (57.7 kg)   BMI 21.17 kg/m   Physical Exam  Constitutional: She is oriented to person, place, and time. She appears well-developed and well-nourished. She is irritable. No distress.  HENT:  Head: Normocephalic and atraumatic.  Right Ear: Hearing and  external ear normal.  Left Ear: Hearing and external ear normal.  Nose: Nose normal.  Mouth/Throat: Oropharynx is clear and moist.  Eyes: Conjunctivae and lids are normal. Right eye exhibits no discharge. Left eye exhibits no discharge. No scleral icterus.  Neck: Neck supple.  Cardiovascular: Normal rate and regular rhythm.   Pulmonary/Chest: Effort normal and breath sounds normal. No respiratory distress.  Abdominal: Soft. Bowel sounds are normal.  Musculoskeletal: Normal range of motion.  Neurological: She is alert and oriented to person, place, and time.  Skin: Skin is intact. No lesion and no rash noted.  Psychiatric: Her speech is normal. Thought content normal. Her mood appears anxious. Her affect is angry. She is slowed and withdrawn. She exhibits a depressed  mood. She expresses no suicidal plans and no homicidal plans.      Assessment & Plan:     1. Clinical depression Mood and motivation level low with spontaneous crying spells. Irritated with herself with low motivation. Uses Clonazepam as recommended by Dr. Evelene CroonKaur (psychiatrist) for anxiety/panic. Recommend Effexor-XR to elevate mood and motivation level. Continue regular follow up with Dr. Evelene CroonKaur and recheck in a month. - venlafaxine XR (EFFEXOR-XR) 37.5 MG 24 hr capsule; Take 1 capsule (37.5 mg total) by mouth daily with breakfast.  Dispense: 30 capsule; Refill: 0

## 2016-04-10 NOTE — Progress Notes (Deleted)
   Patient: Rachel Mercer Female    DOB: [birth year]   20 y.o.   MRN: [id number] Visit Date: 04/10/2016  Today's Provider: Dortha Kernennis Karnes, PA   No chief complaint on file.  Subjective:    HPI    Previous Medications   ALBUTEROL (PROVENTIL HFA;VENTOLIN HFA) 108 (90 BASE) MCG/ACT INHALER    Inhale 2 puffs into the lungs every 6 (six) hours as needed for wheezing or shortness of breath.   BENZEPRO SHORT CONTACT 9.8 % FOAM    Reported on 12/29/2015   BUPROPION (WELLBUTRIN) 75 MG TABLET    Take 0.5 tablets (37.5 mg total) by mouth every morning.   CLONAZEPAM (KLONOPIN) 1 MG TABLET    Take 1 tablet (1 mg total) by mouth 2 (two) times daily as needed for anxiety.   FLUNISOLIDE HFA 80 MCG/ACT AERS    Inhale 1 puff into the lungs 2 (two) times daily.   LEVONORGESTREL-ETHINYL ESTRADIOL (JOLESSA) 0.15-0.03 MG TABLET    Take by mouth.   PROPRANOLOL (INDERAL) 20 MG TABLET    Take 20 mg by mouth 2 (two) times daily.   VALACYCLOVIR (VALTREX) 500 MG TABLET        Review of Systems  Constitutional: Negative.   Respiratory: Negative.   Cardiovascular: Negative.   Psychiatric/Behavioral: Positive for dysphoric mood.    Social History  Substance Use Topics  . Smoking status: Never Smoker  . Smokeless tobacco: Never Used  . Alcohol use 0.0 - 1.8 oz/week     Comment: occasional   Objective:   There were no vitals taken for this visit.  Physical Exam      Assessment & Plan:       Follow up: No Follow-up on file.

## 2016-04-10 NOTE — Patient Instructions (Signed)
Major Depressive Disorder Major depressive disorder is a mental illness. It also may be called clinical depression or unipolar depression. Major depressive disorder usually causes feelings of sadness, hopelessness, or helplessness. Some people with this disorder do not feel particularly sad but lose interest in doing things they used to enjoy (anhedonia). Major depressive disorder also can cause physical symptoms. It can interfere with work, school, relationships, and other normal everyday activities. The disorder varies in severity but is longer lasting and more serious than the sadness we all feel from time to time in our lives. Major depressive disorder often is triggered by stressful life events or major life changes. Examples of these triggers include divorce, loss of your job or home, a move, and the death of a family member or close friend. Sometimes this disorder occurs for no obvious reason at all. People who have family members with major depressive disorder or bipolar disorder are at higher risk for developing this disorder, with or without life stressors. Major depressive disorder can occur at any age. It may occur just once in your life (single episode major depressive disorder). It may occur multiple times (recurrent major depressive disorder). SYMPTOMS People with major depressive disorder have either anhedonia or depressed mood on nearly a daily basis for at least 2 weeks or longer. Symptoms of depressed mood include:  Feelings of sadness (blue or down in the dumps) or emptiness.  Feelings of hopelessness or helplessness.  Tearfulness or episodes of crying (may be observed by others).  Irritability (children and adolescents). In addition to depressed mood or anhedonia or both, people with this disorder have at least four of the following symptoms:  Difficulty sleeping or sleeping too much.   Significant change (increase or decrease) in appetite or weight.   Lack of energy or  motivation.  Feelings of guilt and worthlessness.   Difficulty concentrating, remembering, or making decisions.  Unusually slow movement (psychomotor retardation) or restlessness (as observed by others).   Recurrent wishes for death, recurrent thoughts of self-harm (suicide), or a suicide attempt. People with major depressive disorder commonly have persistent negative thoughts about themselves, other people, and the world. People with severe major depressive disorder may experiencedistorted beliefs or perceptions about the world (psychotic delusions). They also may see or hear things that are not real (psychotic hallucinations). DIAGNOSIS Major depressive disorder is diagnosed through an assessment by your health care provider. Your health care provider will ask aboutaspects of your daily life, such as mood,sleep, and appetite, to see if you have the diagnostic symptoms of major depressive disorder. Your health care provider may ask about your medical history and use of alcohol or drugs, including prescription medicines. Your health care provider also may do a physical exam and blood work. This is because certain medical conditions and the use of certain substances can cause major depressive disorder-like symptoms (secondary depression). Your health care provider also may refer you to a mental health specialist for further evaluation and treatment. TREATMENT It is important to recognize the symptoms of major depressive disorder and seek treatment. The following treatments can be prescribed for this disorder:   Medicine. Antidepressant medicines usually are prescribed. Antidepressant medicines are thought to correct chemical imbalances in the brain that are commonly associated with major depressive disorder. Other types of medicine may be added if the symptoms do not respond to antidepressant medicines alone or if psychotic delusions or hallucinations occur.  Talk therapy. Talk therapy can be  helpful in treating major depressive disorder by providing   support, education, and guidance. Certain types of talk therapy also can help with negative thinking (cognitive behavioral therapy) and with relationship issues that trigger this disorder (interpersonal therapy). A mental health specialist can help determine which treatment is best for you. Most people with major depressive disorder do well with a combination of medicine and talk therapy. Treatments involving electrical stimulation of the brain can be used in situations with extremely severe symptoms or when medicine and talk therapy do not work over time. These treatments include electroconvulsive therapy, transcranial magnetic stimulation, and vagal nerve stimulation.   This information is not intended to replace advice given to you by your health care provider. Make sure you discuss any questions you have with your health care provider.   Document Released: 11/10/2012 Document Revised: 08/06/2014 Document Reviewed: 11/10/2012 Elsevier Interactive Patient Education 2016 [employer]  

## 2016-05-08 ENCOUNTER — Ambulatory Visit (INDEPENDENT_AMBULATORY_CARE_PROVIDER_SITE_OTHER): Payer: 59 | Admitting: Family Medicine

## 2016-05-08 ENCOUNTER — Encounter: Payer: Self-pay | Admitting: Family Medicine

## 2016-05-08 DIAGNOSIS — F329 Major depressive disorder, single episode, unspecified: Secondary | ICD-10-CM | POA: Diagnosis not present

## 2016-05-08 DIAGNOSIS — F32A Depression, unspecified: Secondary | ICD-10-CM

## 2016-05-08 MED ORDER — VENLAFAXINE HCL ER 37.5 MG PO CP24: 37.5000 mg | ORAL_CAPSULE | Freq: Every day | ORAL | 3 refills | Status: DC

## 2016-05-08 NOTE — Progress Notes (Signed)
Patient: Rachel Mercer Female    DOB: [birth year]   20 y.o.   MRN: [id number] Visit Date: 05/08/2016  Today's Provider: Dortha Kernennis Rominger, PA   Chief Complaint  Patient presents with  . Depression  . Follow-up   Subjective:    HPI Depression: Patient presents for 4 week follow up. Patient started Effexor-XR to elevate mood and motivation level and continue clonazepam.  Patient reports excellent compliance with treatment plan. Patient states symptoms have improved. She said some days she feels fatigue but overall symptoms are better.   Patient Active Problem List   Diagnosis Date Noted  . Clinical depression 05/08/2016  . Anxiety 08/18/2015  . Asthma 08/18/2015  . 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 Allergic rhinitis, seasonal 02/16/2015  . 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 APPENDECTOMY    . TONSILLECTOMY  2008   Family History  Problem Relation Age of Onset  . Depression Mother   . Mental illness Mother   . Irritable bowel syndrome Mother   . Colon polyps Mother   . Bipolar disorder Sister   . Mental illness Sister   . Seizures Sister   . Depression Sister   . Cancer Maternal Grandmother     colon  . Alzheimer's disease Maternal Grandmother   . Alzheimer's disease Maternal Grandfather   . Anxiety disorder Brother    No Known Allergies   Previous Medications   ALBUTEROL (PROVENTIL HFA;VENTOLIN HFA) 108 (90 BASE) MCG/ACT INHALER    Inhale 2 puffs into the lungs every 6 (six) hours as needed for wheezing or shortness of breath.   BENZEPRO SHORT CONTACT 9.8 % FOAM    Reported on 12/29/2015   CLONAZEPAM (KLONOPIN) 1 MG TABLET    Take 1 tablet (1 mg total) by mouth 2 (two) times daily as needed for anxiety.   FLUNISOLIDE HFA 80 MCG/ACT AERS    Inhale 1 puff into the lungs 2 (two) times daily.   LEVONORGESTREL-ETHINYL ESTRADIOL (JOLESSA) 0.15-0.03 MG TABLET    Take by mouth.   PROPRANOLOL (INDERAL) 20  MG TABLET    Take 20 mg by mouth 2 (two) times daily.   VALACYCLOVIR (VALTREX) 500 MG TABLET       VENLAFAXINE XR (EFFEXOR-XR) 37.5 MG 24 HR CAPSULE    Take 1 capsule (37.5 mg total) by mouth daily with breakfast.    Review of Systems  Constitutional: Negative.   Respiratory: Negative.   Cardiovascular: Negative.   Psychiatric/Behavioral: Positive for dysphoric mood.    Social History  Substance Use Topics  . Smoking status: Never Smoker  . Smokeless tobacco: Never Used  . Alcohol use 0.0 - 1.8 oz/week     Comment: occasional   Objective:   BP 102/70 (BP Location: Right Arm, Patient Position: Sitting, Cuff Size: Normal)   Pulse 64   Temp 98.6 F (37 C) (Oral)   Resp 16   Wt 126 lb 9.6 oz (57.4 kg)   BMI 21.07 kg/m   Physical Exam  Constitutional: She is oriented to person, place, and time. She appears well-developed and well-nourished. No distress.  HENT:  Head: Normocephalic and atraumatic.  Right Ear: Hearing normal.  Left Ear: Hearing normal.  Nose: Nose normal.  Eyes: Conjunctivae and lids are normal. Right eye exhibits no discharge. Left eye exhibits no discharge. No scleral icterus.  Neck: Neck supple.  Cardiovascular: Normal rate and regular rhythm.   Pulmonary/Chest: Effort normal and breath sounds normal.  No respiratory distress.  Abdominal: Soft. Bowel sounds are normal.  Musculoskeletal: Normal range of motion.  Neurological: She is alert and oriented to person, place, and time.  Skin: Skin is intact. No lesion and no rash noted.  Psychiatric: She has a normal mood and affect. Her speech is normal and behavior is normal. Thought content normal. She expresses no suicidal plans.  Brighter spirits and energy level better.       Assessment & Plan:     1. Depression, unspecified depression type Improved spirits. Not crying as much and sleeping better. Mood more stable and feeling better motivation level. Not needing the Clonazepam. Will continue present  Effexor-XR dosage daily and follow up with psychiatrist regularly.  - venlafaxine XR (EFFEXOR-XR) 37.5 MG 24 hr capsule; Take 1 capsule (37.5 mg total) by mouth daily with breakfast.  Dispense: 30 capsule; Refill: 3

## 2016-06-28 ENCOUNTER — Ambulatory Visit (INDEPENDENT_AMBULATORY_CARE_PROVIDER_SITE_OTHER): Payer: 59 | Admitting: Physician Assistant

## 2016-06-28 ENCOUNTER — Encounter: Payer: Self-pay | Admitting: Physician Assistant

## 2016-06-28 VITALS — BP 104/64 | HR 125 | Temp 98.6°F | Resp 20 | Wt 128.2 lb

## 2016-06-28 DIAGNOSIS — R110 Nausea: Secondary | ICD-10-CM | POA: Diagnosis not present

## 2016-06-28 DIAGNOSIS — K529 Noninfective gastroenteritis and colitis, unspecified: Secondary | ICD-10-CM | POA: Diagnosis not present

## 2016-06-28 MED ORDER — PROMETHAZINE HCL 12.5 MG PO TABS: 12.5000 mg | ORAL_TABLET | Freq: Four times a day (QID) | ORAL | 0 refills | Status: DC | PRN

## 2016-06-28 NOTE — Progress Notes (Signed)
Patient: Rachel Mercer Female    DOB: 1995-11-05   20 y.o.   MRN: [id number] Visit Date: 06/28/2016  Today's Provider: Trey SailorsAdriana M Pollak, PA-C   Chief Complaint  Patient presents with  . Abdominal Pain   Subjective:    Abdominal Pain  This is a new problem. The current episode started yesterday. The onset quality is sudden. The problem occurs constantly. The pain is located in the generalized abdominal region. The quality of the pain is aching. Associated symptoms include diarrhea and nausea. Associated symptoms comments: Fatigue . She has tried nothing for the symptoms.   Patient is a 20 y/o female presenting to the clinic today complaining of nausea x 1 day, one episode of vomiting last night, and diarrhea this morning. She reports yesterday she began suddenly feeling nauseous. She vomited one time last night and has had continue nausea but nor more vomiting. Diarrhea this morning, watery, no blood. Says she has been to the bathroom "many times" but unable to quantify. Able to eat and drink this morning. Reports feeling slightly dizzy. Reports she has anxiety and fast pulse. No travel, recent abx, surgeries or hospitalization.    Previous Medications   ALBUTEROL (PROVENTIL HFA;VENTOLIN HFA) 108 (90 BASE) MCG/ACT INHALER    Inhale 2 puffs into the lungs every 6 (six) hours as needed for wheezing or shortness of breath.   BENZEPRO SHORT CONTACT 9.8 % FOAM    Reported on 12/29/2015   BUPROPION (WELLBUTRIN) 75 MG TABLET    Take 0.5 tablets (37.5 mg total) by mouth every morning.   CLONAZEPAM (KLONOPIN) 1 MG TABLET    Take 1 tablet (1 mg total) by mouth 2 (two) times daily as needed for anxiety.   FLUNISOLIDE HFA 80 MCG/ACT AERS    Inhale 1 puff into the lungs 2 (two) times daily.   LEVONORGESTREL-ETHINYL ESTRADIOL (JOLESSA) 0.15-0.03 MG TABLET    Take by mouth.   PROPRANOLOL (INDERAL) 20 MG TABLET    Take 20 mg by mouth 2 (two) times daily.   VALACYCLOVIR (VALTREX) 500 MG TABLET       VENLAFAXINE XR (EFFEXOR-XR) 37.5 MG 24 HR CAPSULE    Take 1 capsule (37.5 mg total) by mouth daily with breakfast.    Review of Systems  Constitutional: Negative.   Respiratory: Negative.   Cardiovascular: Negative.   Gastrointestinal: Positive for abdominal pain, diarrhea and nausea.    Social History  Substance Use Topics  . Smoking status: Never Smoker  . Smokeless tobacco: Never Used  . Alcohol use 0.0 - 1.8 oz/week     Comment: occasional   Objective:   BP 104/64 (BP Location: Right Arm, Patient Position: Sitting, Cuff Size: Normal)   Pulse (!) 125   Temp 98.6 F (37 C) (Oral)   Resp 20   Wt 128 lb 3.2 oz (58.2 kg)   SpO2 98%   BMI 21.33 kg/m   Physical Exam  Constitutional: She is oriented to person, place, and time. She appears well-developed and well-nourished. She does not appear ill. No distress.  HENT:  Mouth/Throat: Mucous membranes are normal. Mucous membranes are not dry.  Eyes: Conjunctivae are normal.  Cardiovascular: Regular rhythm.   Pulmonary/Chest: Effort normal and breath sounds normal.  Abdominal: Soft. Bowel sounds are normal. She exhibits no distension. There is no tenderness. There is no rebound and no guarding.  Neurological: She is alert and oriented to person, place, and time.  Skin: Skin is warm and dry. She is not diaphoretic.  Psychiatric: She has a normal mood and affect. Her behavior is normal.        Assessment & Plan:      Problem List Items Addressed This Visit    None    Visit Diagnoses    Nausea    -  Primary   Relevant Medications   promethazine (PHENERGAN) 12.5 MG tablet   Gastroenteritis         Patient is 20 y/o female presenting with symptoms consistent with gastroenteritis. Likely viral. Counseled patient on transient nature of symptoms. Gave Rx for antiemetic. May take imodium if she chooses. Work note provided.  No Follow-up on file.   Patient Instructions  Viral Gastroenteritis, Adult Introduction Viral  gastroenteritis is also known as the stomach flu. This condition is caused by certain germs (viruses). These germs can be passed from person to person very easily (are very contagious). This condition can cause sudden watery poop (diarrhea), fever, and throwing up (vomiting). Having watery poop and throwing up can make you feel weak and cause you to get dehydrated. Dehydration can make you tired and thirsty, make you have a dry mouth, and make it so you pee (urinate) less often. Older adults and people with other diseases or a weak defense system (immune system) are at higher risk for dehydration. It is important to replace the fluids that you lose from having watery poop and throwing up. Follow these instructions at home: Follow instructions from your doctor about how to care for yourself at home. Eating and drinking Follow these instructions as told by your doctor:  Take an oral rehydration solution (ORS). This is a drink that is sold at pharmacies and stores.  Drink clear fluids in small amounts as you are able, such as:  Water.  Ice chips.  Diluted fruit juice.  Low-calorie sports drinks.  Eat bland, easy-to-digest foods in small amounts as you are able, such as:  Bananas.  Applesauce.  Rice.  Low-fat (lean) meats.  Toast.  Crackers.  Avoid fluids that have a lot of sugar or caffeine in them.  Avoid alcohol.  Avoid spicy or fatty foods. General instructions  Drink enough fluid to keep your pee (urine) clear or pale yellow.  Wash your hands often. If you cannot use soap and water, use hand sanitizer.  Make sure that all people in your home wash their hands well and often.  Rest at home while you get better.  Take over-the-counter and prescription medicines only as told by your doctor.  Watch your condition for any changes.  Take a warm bath to help with any burning or pain from having watery poop.  Keep all follow-up visits as told by your doctor. This is  important. Contact a doctor if:  You cannot keep fluids down.  Your symptoms get worse.  You have new symptoms.  You feel light-headed or dizzy.  You have muscle cramps. Get help right away if:  You have chest pain.  You feel very weak or you pass out (faint).  You see blood in your throw-up.  Your throw-up looks like coffee grounds.  You have bloody or black poop (stools) or poop that look like tar.  You have a very bad headache, a stiff neck, or both.  You have a rash.  You have very bad pain, cramping, or bloating in your belly (abdomen).  You have trouble breathing.  You are breathing very quickly.  Your heart is beating very quickly.  Your skin feels cold and  clammy.  You feel confused.  You have pain when you pee.  You have signs of dehydration, such as:  Dark pee, hardly any pee, or no pee.  Cracked lips.  Dry mouth.  Sunken eyes.  Sleepiness.  Weakness. This information is not intended to replace advice given to you by your health care provider. Make sure you discuss any questions you have with your health care provider. Document Released: 01/02/2008 Document Revised: 02/03/2016 Document Reviewed: 03/22/2015  2017 Elsevier    The entirety of the information documented in the History of Present Illness, Review of Systems and Physical Exam were personally obtained by me. Portions of this information were initially documented by Kavin Leech, CMA and reviewed by me for thoroughness and accuracy.     Follow up: No Follow-up on file.

## 2016-06-28 NOTE — Patient Instructions (Signed)
Viral Gastroenteritis, Adult Introduction Viral gastroenteritis is also known as the stomach flu. This condition is caused by certain germs (viruses). These germs can be passed from person to person very easily (are very contagious). This condition can cause sudden watery poop (diarrhea), fever, and throwing up (vomiting). Having watery poop and throwing up can make you feel weak and cause you to get dehydrated. Dehydration can make you tired and thirsty, make you have a dry mouth, and make it so you pee (urinate) less often. Older adults and people with other diseases or a weak defense system (immune system) are at higher risk for dehydration. It is important to replace the fluids that you lose from having watery poop and throwing up. Follow these instructions at home: Follow instructions from your doctor about how to care for yourself at home. Eating and drinking Follow these instructions as told by your doctor:  Take an oral rehydration solution (ORS). This is a drink that is sold at pharmacies and stores.  Drink clear fluids in small amounts as you are able, such as:  Water.  Ice chips.  Diluted fruit juice.  Low-calorie sports drinks.  Eat bland, easy-to-digest foods in small amounts as you are able, such as:  Bananas.  Applesauce.  Rice.  Low-fat (lean) meats.  Toast.  Crackers.  Avoid fluids that have a lot of sugar or caffeine in them.  Avoid alcohol.  Avoid spicy or fatty foods. General instructions  Drink enough fluid to keep your pee (urine) clear or pale yellow.  Wash your hands often. If you cannot use soap and water, use hand sanitizer.  Make sure that all people in your home wash their hands well and often.  Rest at home while you get better.  Take over-the-counter and prescription medicines only as told by your doctor.  Watch your condition for any changes.  Take a warm bath to help with any burning or pain from having watery poop.  Keep all  follow-up visits as told by your doctor. This is important. Contact a doctor if:  You cannot keep fluids down.  Your symptoms get worse.  You have new symptoms.  You feel light-headed or dizzy.  You have muscle cramps. Get help right away if:  You have chest pain.  You feel very weak or you pass out (faint).  You see blood in your throw-up.  Your throw-up looks like coffee grounds.  You have bloody or black poop (stools) or poop that look like tar.  You have a very bad headache, a stiff neck, or both.  You have a rash.  You have very bad pain, cramping, or bloating in your belly (abdomen).  You have trouble breathing.  You are breathing very quickly.  Your heart is beating very quickly.  Your skin feels cold and clammy.  You feel confused.  You have pain when you pee.  You have signs of dehydration, such as:  Dark pee, hardly any pee, or no pee.  Cracked lips.  Dry mouth.  Sunken eyes.  Sleepiness.  Weakness. This information is not intended to replace advice given to you by your health care provider. Make sure you discuss any questions you have with your health care provider. Document Released: 01/02/2008 Document Revised: 02/03/2016 Document Reviewed: 03/22/2015  2017 Elsevier  

## 2016-08-09 ENCOUNTER — Encounter: Payer: Self-pay | Admitting: Family Medicine

## 2016-08-09 ENCOUNTER — Ambulatory Visit (INDEPENDENT_AMBULATORY_CARE_PROVIDER_SITE_OTHER): Payer: 59 | Admitting: Family Medicine

## 2016-08-09 VITALS — BP 108/62 | HR 103 | Temp 98.7°F | Resp 18 | Wt 133.0 lb

## 2016-08-09 DIAGNOSIS — F419 Anxiety disorder, unspecified: Secondary | ICD-10-CM

## 2016-08-09 DIAGNOSIS — F329 Major depressive disorder, single episode, unspecified: Secondary | ICD-10-CM

## 2016-08-09 DIAGNOSIS — F32A Depression, unspecified: Secondary | ICD-10-CM

## 2016-08-09 NOTE — Progress Notes (Signed)
Patient: Rachel StallKatelyn L Barbuto Female    DOB: [birth year]   21 y.o.   MRN: [id number] Visit Date: 08/09/2016  Today's Provider: Dortha Kernennis Mcelreath, PA   Chief Complaint  Patient presents with  . Anxiety  . Depression  . Follow-up   Subjective:    HPI Patient is here for 3 month follow up on anxiety and depression. Patient's last OV was 05/08/2016. She was advised to continue Effexor XR and follow up with psychiatrist regularly. Patient reports good compliance with treatment plan. Symptoms are stable. Patient feels depression is better now, she feels "blah" at times but overall improved.   Patient Active Problem List   Diagnosis Date Noted  . Clinical depression 05/08/2016  . Anxiety 08/18/2015  . Asthma 08/18/2015  . 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 Allergic rhinitis, seasonal 02/16/2015  . 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 APPENDECTOMY    . TONSILLECTOMY  2008   Family History  Problem Relation Age of Onset  . Depression Mother   . Mental illness Mother   . Irritable bowel syndrome Mother   . Colon polyps Mother   . Bipolar disorder Sister   . Mental illness Sister   . Seizures Sister   . Depression Sister   . Cancer Maternal Grandmother     colon  . Alzheimer's disease Maternal Grandmother   . Alzheimer's disease Maternal Grandfather   . Anxiety disorder Brother    No Known Allergies   Previous Medications   ALBUTEROL (PROVENTIL HFA;VENTOLIN HFA) 108 (90 BASE) MCG/ACT INHALER    Inhale 2 puffs into the lungs every 6 (six) hours as needed for wheezing or shortness of breath.   BENZEPRO SHORT CONTACT 9.8 % FOAM    Reported on 12/29/2015   CLONAZEPAM (KLONOPIN) 1 MG TABLET    Take 1 tablet (1 mg total) by mouth 2 (two) times daily as needed for anxiety.   FLUNISOLIDE HFA 80 MCG/ACT AERS    Inhale 1 puff into the lungs 2 (two) times daily.   LEVONORGESTREL-ETHINYL ESTRADIOL (JOLESSA) 0.15-0.03 MG TABLET     Take by mouth.   PROPRANOLOL (INDERAL) 20 MG TABLET    Take 20 mg by mouth 2 (two) times daily.   VALACYCLOVIR (VALTREX) 500 MG TABLET       VENLAFAXINE XR (EFFEXOR-XR) 37.5 MG 24 HR CAPSULE    Take 1 capsule (37.5 mg total) by mouth daily with breakfast.    Review of Systems  Constitutional: Negative.   Respiratory: Negative.   Cardiovascular: Negative.   Psychiatric/Behavioral: Positive for dysphoric mood. The patient is nervous/anxious.     Social History  Substance Use Topics  . Smoking status: Never Smoker  . Smokeless tobacco: Never Used  . Alcohol use 0.0 - 1.8 oz/week     Comment: occasional   Objective:   BP 108/62 (BP Location: Right Arm, Patient Position: Sitting, Cuff Size: Normal)   Pulse (!) 103   Temp 98.7 F (37.1 C) (Oral)   Resp 18   Wt 133 lb (60.3 kg)   SpO2 99%   BMI 22.13 kg/m   Physical Exam  Constitutional: She is oriented to person, place, and time. She appears well-developed and well-nourished. No distress.  HENT:  Head: Normocephalic and atraumatic.  Right Ear: Hearing normal.  Left Ear: Hearing normal.  Nose: Nose normal.  Eyes: Conjunctivae and lids are normal. Right eye exhibits no discharge. Left eye exhibits no discharge. No scleral icterus.  Neck: Neck supple.  Cardiovascular: Normal rate and regular rhythm.   Pulmonary/Chest: Effort normal and breath sounds normal. No respiratory distress.  Abdominal: Bowel sounds are normal.  Musculoskeletal: Normal range of motion.  Neurological: She is alert and oriented to person, place, and time.  Skin: Skin is intact. No lesion and no rash noted.  Psychiatric: She has a normal mood and affect. Her speech is normal and behavior is normal. Thought content normal. She expresses no suicidal plans and no homicidal plans.      Assessment & Plan:     1. Depression, unspecified depression type Stable and well controlled on the Venlafaxine daily. Still counseling with psychiatrist (Dr. Evelene Croon).  Sleeping well and good appetite. Enjoyment back in life and accomplishing tasks better. No crying spells now. Will recheck in 6 months. Continue follow up with psychiatrist as planned.  2. Anxiety Rare episodes of anxiety/panic. Improved since starting the Venlafaxine.

## 2016-08-30 ENCOUNTER — Other Ambulatory Visit: Payer: Self-pay | Admitting: Family Medicine

## 2016-08-30 DIAGNOSIS — F32A Depression, unspecified: Secondary | ICD-10-CM

## 2016-08-30 DIAGNOSIS — F329 Major depressive disorder, single episode, unspecified: Secondary | ICD-10-CM

## 2016-08-30 MED ORDER — VENLAFAXINE HCL ER 37.5 MG PO CP24: 37.5000 mg | ORAL_CAPSULE | Freq: Every day | ORAL | 3 refills | Status: DC

## 2016-08-30 NOTE — Progress Notes (Signed)
Psychiatrist tried Citalopram but gave her the option of continuing the Venlafaxine if no better. Has been on the Citalopram for 8 weeks and felt better on the Venlafaxine. Will refill and advised to continue follow up with psychiatrist.

## 2016-09-04 ENCOUNTER — Ambulatory Visit (INDEPENDENT_AMBULATORY_CARE_PROVIDER_SITE_OTHER): Payer: 59 | Admitting: Family Medicine

## 2016-09-04 ENCOUNTER — Telehealth: Payer: Self-pay | Admitting: Family Medicine

## 2016-09-04 ENCOUNTER — Encounter: Payer: Self-pay | Admitting: Family Medicine

## 2016-09-04 VITALS — BP 90/56 | HR 93 | Temp 99.4°F | Resp 16 | Wt 124.0 lb

## 2016-09-04 DIAGNOSIS — K582 Mixed irritable bowel syndrome: Secondary | ICD-10-CM

## 2016-09-04 MED ORDER — DICYCLOMINE HCL 20 MG PO TABS: 20.0000 mg | ORAL_TABLET | Freq: Four times a day (QID) | ORAL | 1 refills | Status: DC

## 2016-09-04 NOTE — Patient Instructions (Signed)
Give the food choice list 2-4 weeks to work and use the prescribed medication as needed. If this is not helping call for referral to a stomach specialist.

## 2016-09-04 NOTE — Progress Notes (Signed)
Subjective:     Patient ID: Rachel StallKatelyn L Maler, female   DOB: 01-19-1996, 21 y.o.   MRN: [id number]  HPI  Chief Complaint  Patient presents with  . Diarrhea    Patient comes in office today with complaints of diarrhea that has been intermittent for the past month. Patient reports her mother has a history of IBS and believes that she may have it to. Patient reports sharp rectal pain, feeling flushing, cold chills and sweats.   States she will have a day of frequent loose stools and will take an otc antidiarrheal. She will subsequently either have formed stools or will feel constipated for a couple of days. Continues to be followed by psychiatry, Dr.Kaur, for depression. She can recall some of her loose stool episodes have occurred after stressful events. She is currently just starting a new office job. Have provided her with a FODMAP food list. She remarks she eats a lot of foods with a high FODMAP content.   Review of Systems     Objective:   Physical Exam  Constitutional: She appears well-developed and well-nourished. No distress.  Abdominal: Soft. There is tenderness (mild in epigastric and LLQ area). There is no guarding.       Assessment:    1. Irritable bowel syndrome with both constipation and diarrhea - dicyclomine (BENTYL) 20 MG tablet; Take 1 tablet (20 mg total) by mouth every 6 (six) hours. As needed for bowel cramps or diarrhea  Dispense: 28 tablet; Refill: 1    Plan:    Continue FODMAP dietary choices and Bentyl as needed for 2-4 weeks. If not helping call for G.I. Referral.

## 2016-09-24 ENCOUNTER — Other Ambulatory Visit: Payer: Self-pay | Admitting: Family Medicine

## 2016-09-24 DIAGNOSIS — J4520 Mild intermittent asthma, uncomplicated: Secondary | ICD-10-CM

## 2016-09-24 MED ORDER — ALBUTEROL SULFATE HFA 108 (90 BASE) MCG/ACT IN AERS: 2.0000 | INHALATION_SPRAY | Freq: Four times a day (QID) | RESPIRATORY_TRACT | 2 refills | Status: DC | PRN

## 2016-10-04 ENCOUNTER — Encounter: Payer: Self-pay | Admitting: Family Medicine

## 2016-10-04 ENCOUNTER — Ambulatory Visit (INDEPENDENT_AMBULATORY_CARE_PROVIDER_SITE_OTHER): Payer: 59 | Admitting: Family Medicine

## 2016-10-04 VITALS — BP 118/72 | HR 97 | Temp 98.1°F | Resp 16 | Wt 128.4 lb

## 2016-10-04 DIAGNOSIS — R1031 Right lower quadrant pain: Secondary | ICD-10-CM

## 2016-10-04 DIAGNOSIS — R112 Nausea with vomiting, unspecified: Secondary | ICD-10-CM

## 2016-10-04 LAB — POCT URINALYSIS DIPSTICK
Bilirubin, UA: NEGATIVE
Glucose, UA: NEGATIVE
Ketones, UA: NEGATIVE
LEUKOCYTES UA: NEGATIVE
NITRITE UA: NEGATIVE
PH UA: 6
PROTEIN UA: NEGATIVE
RBC UA: NEGATIVE
Spec Grav, UA: 1.01
UROBILINOGEN UA: 0.2

## 2016-10-04 LAB — POCT URINE PREGNANCY: PREG TEST UR: NEGATIVE

## 2016-10-04 MED ORDER — ONDANSETRON HCL 4 MG PO TABS: 4.0000 mg | ORAL_TABLET | Freq: Three times a day (TID) | ORAL | 0 refills | Status: DC | PRN

## 2016-10-04 NOTE — Patient Instructions (Signed)
We will call you about the ultrasound schedule.

## 2016-10-04 NOTE — Progress Notes (Signed)
Subjective:     Patient ID: Rachel Mercer, female   DOB: May 01, 1996, 21 y.o.   MRN: [id number]  HPI  Chief Complaint  Patient presents with  . Bloated    Patient comes in office today with complaints of bloating and nausea for the past week. Patient reports that bloating is relieved when she is laying down but when she is standing or moving she feels tightness in her abdominal area. Patient denies fever, diarrhea, constipation or gas but states that she did vomit this morning. Patient has not taken any otc medication to relieve symptoms.   States her IBS sx have improved with use of FODMAP diet and occasional Bentyl. States she does forget to take a BCP from time to time. No hx of ovarian cysts. S/p appendectomy.   Review of Systems     Objective:   Physical Exam  Constitutional: She appears well-developed and well-nourished. No distress.  Abdominal: Soft. Bowel sounds are normal. There is tenderness (right lower quadrant). There is guarding.       Assessment:    1. Right lower quadrant abdominal pain: evaluate for ovarian cyst - POCT urinalysis dipstick - POCT urine pregnancy - US Transvaginal Non-OB; Future - US Pelvis Complete; Future  2. Non-intractable vomiting with nausea, unspecified vomiting type - POCT urinalysis dipstick - POCT urine pregnancy - ondansetron (ZOFRAN) 4 MG tablet; Take 1 tablet (4 mg total) by mouth every 8 (eight) hours as needed for nausea or vomiting.  Dispense: 12 tablet; Refill: 0    Plan:    Further f/u pending U/S results. Work excuse for 3/8-10/05/16

## 2016-10-08 ENCOUNTER — Ambulatory Visit
Admission: RE | Admit: 2016-10-08 | Discharge: 2016-10-08 | Disposition: A | Discharge status: Home / Self Care | Payer: 59 | Source: Ambulatory Visit | Attending: Family Medicine | Admitting: Family Medicine

## 2016-10-08 ENCOUNTER — Other Ambulatory Visit: Payer: Self-pay | Admitting: Obstetrics & Gynecology

## 2016-10-08 DIAGNOSIS — R1031 Right lower quadrant pain: Secondary | ICD-10-CM | POA: Insufficient documentation

## 2016-10-09 ENCOUNTER — Encounter: Payer: Self-pay | Admitting: Family Medicine

## 2016-10-09 ENCOUNTER — Telehealth: Payer: Self-pay

## 2016-10-09 ENCOUNTER — Other Ambulatory Visit: Payer: Self-pay | Admitting: Family Medicine

## 2016-10-09 DIAGNOSIS — R1031 Right lower quadrant pain: Secondary | ICD-10-CM

## 2016-10-09 NOTE — Telephone Encounter (Signed)
Patient has been advised. KW 

## 2016-10-09 NOTE — Telephone Encounter (Signed)
Please review. KW 

## 2016-10-09 NOTE — Telephone Encounter (Signed)
We will set up a CT scan to look at her abdomen/pelvis. Referral in progress

## 2016-10-09 NOTE — Telephone Encounter (Signed)
-----   Message from Anola Gurneyobert Chauvin, [place] sent at 10/09/2016  8:31 AM EDT ----- Ultrasounds ok. How is she feeling?

## 2016-10-09 NOTE — Telephone Encounter (Signed)
Refill; patient also needs Annual Exam and PAP scheduled soon

## 2016-10-09 NOTE — Telephone Encounter (Signed)
LMTCB-KW 

## 2016-10-09 NOTE — Telephone Encounter (Signed)
Message left for pt to cb and schedule annual.

## 2016-10-09 NOTE — Telephone Encounter (Signed)
Pt wants Nadine CountsBob to know she is having pain on right and left side now.  The right side seems a little more tender than last week.  CB#[phone]/MW

## 2016-10-09 NOTE — Telephone Encounter (Signed)
I have extended her work excuse to the end of this week and is up front for pickup. Would use ibuprofen or Naproxen for discomfort or the dicyclomine (Bentyl) if she is moving her bowels ok.

## 2016-10-09 NOTE — Telephone Encounter (Signed)
Patient reports that she is still nauseas, and when ultrasound tech pressed on abdomen patient states it was very painful. Patient reports that she is still bloated, she describes abdominal pain is sharp and intermittent. Patient states that since you told her to d/c bentyl her bowels have changed, she states stools are less frequent and hard.

## 2016-10-16 ENCOUNTER — Ambulatory Visit
Admission: RE | Admit: 2016-10-16 | Discharge: 2016-10-16 | Disposition: A | Discharge status: Home / Self Care | Payer: 59 | Source: Ambulatory Visit | Attending: Family Medicine | Admitting: Family Medicine

## 2016-10-16 ENCOUNTER — Telehealth: Payer: Self-pay

## 2016-10-16 ENCOUNTER — Other Ambulatory Visit: Payer: Self-pay | Admitting: Family Medicine

## 2016-10-16 ENCOUNTER — Telehealth: Payer: Self-pay | Admitting: Family Medicine

## 2016-10-16 DIAGNOSIS — R1110 Vomiting, unspecified: Secondary | ICD-10-CM | POA: Diagnosis not present

## 2016-10-16 DIAGNOSIS — R1031 Right lower quadrant pain: Secondary | ICD-10-CM | POA: Insufficient documentation

## 2016-10-16 DIAGNOSIS — R109 Unspecified abdominal pain: Secondary | ICD-10-CM | POA: Diagnosis not present

## 2016-10-16 DIAGNOSIS — K589 Irritable bowel syndrome without diarrhea: Secondary | ICD-10-CM

## 2016-10-16 MED ORDER — IOPAMIDOL (ISOVUE-300) INJECTION 61%
85.0000 mL | Freq: Once | INTRAVENOUS | Status: DC | PRN
Start: 2016-10-16 — End: 2016-10-17

## 2016-10-16 NOTE — Telephone Encounter (Signed)
Ok for return to work note if she is feeling up to it. See CT scan note.

## 2016-10-16 NOTE — Telephone Encounter (Signed)
For review. KW 

## 2016-10-16 NOTE — Telephone Encounter (Signed)
Letter available for pick up, patient was advised by phone team. Renette ButtersKW

## 2016-10-16 NOTE — Telephone Encounter (Signed)
Pt is requesting a work note to return back to work.  CB#[phone]/MW

## 2016-10-16 NOTE — Telephone Encounter (Signed)
-----   Message from Anola Gurneyobert Chauvin, [place] sent at 10/16/2016  2:24 PM EDT ----- CTscan normal. I will start referral to G.I, Dr. Mechele CollinElliott. How are you feeling?

## 2016-10-16 NOTE — Telephone Encounter (Signed)
Patient walked into office and was advised of report, she states that she is feeling slightly better, pain on left side of abdomen greatly improved but patient still complains of pain at times on the right side of abdomen that she describes as sharp. Patient also reports nausea and G.I upset at times with episodes of constipation. Work not given to patient and instructed that she will get a call from office for G.I referral.KW

## 2016-10-23 ENCOUNTER — Telehealth: Payer: Self-pay

## 2016-10-23 ENCOUNTER — Other Ambulatory Visit: Payer: Self-pay | Admitting: Family Medicine

## 2016-10-23 ENCOUNTER — Other Ambulatory Visit: Payer: Self-pay

## 2016-10-23 DIAGNOSIS — R5383 Other fatigue: Secondary | ICD-10-CM

## 2016-10-23 NOTE — Telephone Encounter (Signed)
I have sent in lab orders to check on her fatigue. She [facility]

## 2016-10-23 NOTE — Telephone Encounter (Signed)
Patient called office today to address new symptom of fatigue. Patient states that she feels very tire and has no energy. Patient states the other day patient states that she woke up at 9AM and by 11:30AM she fell asleep and didn't wake back up till 6AM the next morning. Patient reports that she has been sleeping over 12hr. Please advise. KW

## 2016-10-23 NOTE — Telephone Encounter (Signed)
LMTCB ED 

## 2016-10-23 NOTE — Telephone Encounter (Signed)
Advised  ED 

## 2016-10-24 ENCOUNTER — Telehealth: Payer: Self-pay

## 2016-10-24 LAB — CBC WITH DIFFERENTIAL/PLATELET

## 2016-10-24 LAB — RENAL FUNCTION PANEL
Albumin: 4.4 g/dL (ref 3.5–5.5)
BUN/Creatinine Ratio: 12 (ref 9–23)
BUN: 10 mg/dL (ref 6–20)
CO2: 22 mmol/L (ref 18–29)
CREATININE: 0.83 mg/dL (ref 0.57–1.00)
Calcium: 9.8 mg/dL (ref 8.7–10.2)
Chloride: 98 mmol/L (ref 96–106)
GFR, EST AFRICAN AMERICAN: 117 mL/min/{1.73_m2} (ref >59)
GFR, EST NON AFRICAN AMERICAN: 101 mL/min/{1.73_m2} (ref >59)
Glucose: 156 mg/dL — ABNORMAL HIGH (ref 65–99)
Phosphorus: 2.1 mg/dL — ABNORMAL LOW (ref 2.5–4.5)
Potassium: 3.8 mmol/L (ref 3.5–5.2)
Sodium: 136 mmol/L (ref 134–144)

## 2016-10-24 LAB — FERRITIN: Ferritin: 45 ng/mL (ref 15–150)

## 2016-10-24 LAB — TSH: TSH: 0.586 u[IU]/mL (ref 0.450–4.500)

## 2016-10-24 LAB — T4, FREE: Free T4: 1.23 ng/dL (ref 0.82–1.77)

## 2016-10-24 NOTE — Telephone Encounter (Signed)
Advised  ED 

## 2016-10-24 NOTE — Telephone Encounter (Signed)
-----   Message from Anola Gurneyobert Chauvin, [place] sent at 10/24/2016  7:49 AM EDT ----- Thyroid, kidney and iron level labs were ok. They were not able to finish the blood count due to inadequate sample. They should be contacting you about drawing again.

## 2016-10-24 NOTE — Telephone Encounter (Signed)
LMTCB-KW 

## 2016-10-25 DIAGNOSIS — R5383 Other fatigue: Secondary | ICD-10-CM | POA: Diagnosis not present

## 2016-10-26 ENCOUNTER — Telehealth: Payer: Self-pay

## 2016-10-26 LAB — CBC WITH DIFFERENTIAL/PLATELET
Basophils Absolute: 0 10*3/uL (ref 0.0–0.2)
Basos: 0 %
EOS (ABSOLUTE): 0.3 10*3/uL (ref 0.0–0.4)
EOS: 3 %
HEMATOCRIT: 47.5 % — AB (ref 34.0–46.6)
HEMOGLOBIN: 16 g/dL — AB (ref 11.1–15.9)
IMMATURE GRANULOCYTES: 0 %
Immature Grans (Abs): 0 10*3/uL (ref 0.0–0.1)
LYMPHS ABS: 2.2 10*3/uL (ref 0.7–3.1)
Lymphs: 29 %
MCH: 32.7 pg (ref 26.6–33.0)
MCHC: 33.7 g/dL (ref 31.5–35.7)
MCV: 97 fL (ref 79–97)
MONOCYTES: 9 %
Monocytes Absolute: 0.7 10*3/uL (ref 0.1–0.9)
Neutrophils Absolute: 4.6 10*3/uL (ref 1.4–7.0)
Neutrophils: 59 %
Platelets: 354 10*3/uL (ref 150–379)
RBC: 4.89 x10E6/uL (ref 3.77–5.28)
RDW: 11.9 % — ABNORMAL LOW (ref 12.3–15.4)
WBC: 7.8 10*3/uL (ref 3.4–10.8)

## 2016-10-26 NOTE — Telephone Encounter (Signed)
LMTCB 10/26/2016  Thanks,   -Laura  

## 2016-10-26 NOTE — Telephone Encounter (Signed)
-----   Message from [place], [place] sent at 10/26/2016  7:28 AM EDT ----- No anemia or white count elevation suggesting infection.

## 2016-10-26 NOTE — Telephone Encounter (Signed)
Pt advised.   Thanks,   -Laura  

## 2016-11-01 DIAGNOSIS — R194 Change in bowel habit: Secondary | ICD-10-CM | POA: Diagnosis not present

## 2016-11-01 DIAGNOSIS — R1013 Epigastric pain: Secondary | ICD-10-CM | POA: Diagnosis not present

## 2016-11-01 DIAGNOSIS — K30 Functional dyspepsia: Secondary | ICD-10-CM | POA: Diagnosis not present

## 2016-12-17 DIAGNOSIS — R194 Change in bowel habit: Secondary | ICD-10-CM | POA: Diagnosis not present

## 2016-12-17 DIAGNOSIS — R1013 Epigastric pain: Secondary | ICD-10-CM | POA: Diagnosis not present

## 2016-12-17 DIAGNOSIS — K30 Functional dyspepsia: Secondary | ICD-10-CM | POA: Diagnosis not present

## 2017-02-06 ENCOUNTER — Ambulatory Visit: Payer: 59 | Admitting: Family Medicine

## 2017-02-07 ENCOUNTER — Ambulatory Visit: Payer: Self-pay | Admitting: Family Medicine

## 2017-02-08 ENCOUNTER — Encounter: Payer: Self-pay | Admitting: Family Medicine

## 2017-02-08 ENCOUNTER — Ambulatory Visit (INDEPENDENT_AMBULATORY_CARE_PROVIDER_SITE_OTHER): Payer: 59 | Admitting: Family Medicine

## 2017-02-08 VITALS — BP 88/60 | HR 102 | Temp 98.6°F | Wt 127.0 lb

## 2017-02-08 DIAGNOSIS — F329 Major depressive disorder, single episode, unspecified: Secondary | ICD-10-CM

## 2017-02-08 DIAGNOSIS — K529 Noninfective gastroenteritis and colitis, unspecified: Secondary | ICD-10-CM

## 2017-02-08 DIAGNOSIS — F32A Depression, unspecified: Secondary | ICD-10-CM

## 2017-02-08 NOTE — Progress Notes (Signed)
Patient: Rachel Mercer Female    DOB: Apr 29, 1996   21 y.o.   MRN: [id number] Visit Date: 02/08/2017  Today's Provider: Dortha Kernennis Mcneese, PA   Chief Complaint  Patient presents with  . Depression  . Follow-up   Subjective:    HPI Patient is here for 6 month follow up on anxiety and depression. Last OV was 08/09/2016. Patient was advised to continue Venlafaxine daily and follow up with Dr.Kaur. She reports good compliance with treatment plan. Dr.Kaur started patient on Alprazolam 1 mg daily PRN. Patient states symptoms are stable, but not improved. She reports crying spells are better but she is not motivated especially when working.         Patient Active Problem List   Diagnosis Date Noted  . Clinical depression 05/08/2016  . Anxiety 08/18/2015  . Asthma 08/18/2015  . 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 Allergic rhinitis, seasonal 02/16/2015  . 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 APPENDECTOMY    . TONSILLECTOMY  2008   Family History  Problem Relation Age of Onset  . Depression Mother   . Mental illness Mother   . Irritable bowel syndrome Mother   . Colon polyps Mother   . Bipolar disorder Sister   . Mental illness Sister   . Seizures Sister   . Depression Sister   . Cancer Maternal Grandmother        colon  . Alzheimer's disease Maternal Grandmother   . Alzheimer's disease Maternal Grandfather   . Anxiety disorder Brother    No Known Allergies   Previous Medications   ALBUTEROL (PROVENTIL HFA;VENTOLIN HFA) 108 (90 BASE) MCG/ACT INHALER    Inhale 2 puffs into the lungs every 6 (six) hours as needed for wheezing or shortness of breath.   ALPRAZOLAM (XANAX) 1 MG TABLET       CLONAZEPAM (KLONOPIN) 1 MG TABLET    Take 1 tablet (1 mg total) by mouth 2 (two) times daily as needed for anxiety.   DICYCLOMINE (BENTYL) 20 MG TABLET    Take 1 tablet (20 mg total) by mouth every 6 (six) hours. As needed for  bowel cramps or diarrhea   FLUNISOLIDE HFA 80 MCG/ACT AERS    Inhale 1 puff into the lungs 2 (two) times daily.   LEVONORGESTREL-ETHINYL ESTRADIOL (JOLESSA) 0.15-0.03 MG TABLET    Take by mouth.   OMEPRAZOLE (PRILOSEC) 20 MG CAPSULE       ONDANSETRON (ZOFRAN) 4 MG TABLET    Take 1 tablet (4 mg total) by mouth every 8 (eight) hours as needed for nausea or vomiting.   PROPRANOLOL (INDERAL) 20 MG TABLET    Take 20 mg by mouth 2 (two) times daily.   RANITIDINE (ZANTAC) 150 MG CAPSULE       VALACYCLOVIR (VALTREX) 500 MG TABLET    take 1 tablet by mouth once daily for 30 DAYS   VENLAFAXINE XR (EFFEXOR-XR) 37.5 MG 24 HR CAPSULE    Take 1 capsule (37.5 mg total) by mouth daily with breakfast.    Review of Systems  Constitutional: Negative.   Respiratory: Negative.   Cardiovascular: Negative.   Psychiatric/Behavioral: Positive for dysphoric mood. The patient is nervous/anxious.     Social History  Substance Use Topics  . Smoking status: Never Smoker  . Smokeless tobacco: Never Used  . Alcohol use 0.0 - 1.8 oz/week     Comment: occasional   Objective:   BP (!) 88/60 (BP  Location: Right Arm, Patient Position: Sitting, Cuff Size: Normal)   Pulse (!) 102   Temp 98.6 F (37 C) (Oral)   Wt 127 lb (57.6 kg)   SpO2 96%   BMI 21.13 kg/m   Physical Exam  Constitutional: She is oriented to person, place, and time. She appears well-developed and well-nourished. No distress.  HENT:  Head: Normocephalic and atraumatic.  Right Ear: Hearing normal.  Left Ear: Hearing normal.  Nose: Nose normal.  Eyes: Conjunctivae and lids are normal. Right eye exhibits no discharge. Left eye exhibits no discharge. No scleral icterus.  Cardiovascular: Normal rate.   Pulmonary/Chest: Effort normal and breath sounds normal. No respiratory distress.  Abdominal: Soft. Bowel sounds are normal.  Musculoskeletal: Normal range of motion.  Neurological: She is alert and oriented to person, place, and time.  Skin: Skin  is intact. No lesion and no rash noted.  Psychiatric: Her speech is normal and behavior is normal. Thought content normal. Her affect is blunt.      Assessment & Plan:     1. Depression, unspecified depression type Still tolerating Effexor-XR 37.5 mg qd and follow ing up with Dr. Evelene Mercer (psychiatrist) regularly. Dr. Evelene Mercer gave a prescription for Xanax 1 mg for anxiety/panic prn use. Still feeling motivation level is still low. May increase Effexor-XR to 75 mg qd and continue follow up with Dr. Evelene Mercer. No significant crying spells or suicidal ideation.  2. Gastroenteritis Has some nausea, vomiting and stomach cramps with diarrhea the past 2 days. Feels she had a stomach "flu" or "food poisoning". May use Pepto-Bismol. No longer vomiting or significant nausea. Some cramps occasionally now. Recommend bland diet and increased fluids for the next day or two. Recheck prn.

## 2017-03-01 ENCOUNTER — Other Ambulatory Visit: Payer: Self-pay | Admitting: Family Medicine

## 2017-03-01 DIAGNOSIS — F329 Major depressive disorder, single episode, unspecified: Secondary | ICD-10-CM

## 2017-03-01 DIAGNOSIS — F32A Depression, unspecified: Secondary | ICD-10-CM

## 2017-03-01 MED ORDER — VENLAFAXINE HCL ER 75 MG PO CP24: 75.0000 mg | ORAL_CAPSULE | Freq: Every day | ORAL | 0 refills | Status: DC

## 2017-03-01 MED ORDER — VENLAFAXINE HCL ER 75 MG PO CP24: 75.0000 mg | ORAL_CAPSULE | Freq: Every day | ORAL | 3 refills | Status: DC

## 2017-03-01 NOTE — Telephone Encounter (Signed)
Please review-aa 

## 2017-03-01 NOTE — Telephone Encounter (Signed)
Lmtcb, need to know for mail order does patient needs this sent in or going to pick up RX to mail it in herself-aa

## 2017-03-01 NOTE — Telephone Encounter (Signed)
Pt needs [facility] day supply [facility] [address] for her new dosage (pt thinks 75mg ) of venlafaxine XR (EFFEXOR-XR) .  Mother states she doubled up on her old medication and is about to run out.    Pt's mother also states she needs to have a 90 day RX for the mail order pharmacy as well.

## 2017-03-01 NOTE — Telephone Encounter (Signed)
Mom stated that they would like our office to send to [facility]. [facility] has been added to pt's preferred pharmacies. Please advise. Thanks TNP

## 2017-03-01 NOTE — Telephone Encounter (Signed)
Sent 30 day supply to [facility]. Mail order prescription printed for pick up at the front desk.

## 2017-03-01 NOTE — Telephone Encounter (Signed)
Done-aa 

## 2017-03-22 ENCOUNTER — Other Ambulatory Visit: Payer: Self-pay | Admitting: Advanced Practice Midwife

## 2017-03-22 DIAGNOSIS — Z30011 Encounter for initial prescription of contraceptive pills: Secondary | ICD-10-CM

## 2017-03-22 DIAGNOSIS — B009 Herpesviral infection, unspecified: Secondary | ICD-10-CM

## 2017-06-06 ENCOUNTER — Telehealth: Payer: Self-pay

## 2017-06-06 NOTE — Telephone Encounter (Signed)
Pt calling triage line for a refill on her Valtrex. Please call her at [phone]. She needs an annual exam. Once scheduled send message to JEG. Thanks

## 2017-06-11 NOTE — Telephone Encounter (Signed)
Called and left voicemail for pt to call back to be schedule °

## 2017-06-21 ENCOUNTER — Encounter: Payer: Self-pay | Admitting: Emergency Medicine

## 2017-06-21 ENCOUNTER — Emergency Department
Admission: EM | Admit: 2017-06-21 | Discharge: 2017-06-21 | Disposition: A | Discharge status: Home / Self Care | Payer: 59 | Attending: Emergency Medicine | Admitting: Emergency Medicine

## 2017-06-21 ENCOUNTER — Other Ambulatory Visit: Payer: Self-pay

## 2017-06-21 DIAGNOSIS — Z79899 Other long term (current) drug therapy: Secondary | ICD-10-CM | POA: Diagnosis not present

## 2017-06-21 DIAGNOSIS — R251 Tremor, unspecified: Secondary | ICD-10-CM | POA: Diagnosis not present

## 2017-06-21 DIAGNOSIS — R064 Hyperventilation: Secondary | ICD-10-CM | POA: Insufficient documentation

## 2017-06-21 DIAGNOSIS — J45909 Unspecified asthma, uncomplicated: Secondary | ICD-10-CM | POA: Diagnosis not present

## 2017-06-21 DIAGNOSIS — R4182 Altered mental status, unspecified: Secondary | ICD-10-CM | POA: Insufficient documentation

## 2017-06-21 DIAGNOSIS — F410 Panic disorder [episodic paroxysmal anxiety] without agoraphobia: Secondary | ICD-10-CM | POA: Diagnosis not present

## 2017-06-21 HISTORY — DX: Unspecified asthma, uncomplicated: J45.909

## 2017-06-21 MED ORDER — HALOPERIDOL LACTATE 5 MG/ML IJ SOLN
INTRAMUSCULAR | Status: AC
  Administered 2017-06-21: 5 mg via INTRAMUSCULAR
  Filled 2017-06-21: qty 1

## 2017-06-21 MED ORDER — MIDAZOLAM HCL 5 MG/5ML IJ SOLN
INTRAMUSCULAR | Status: AC
  Administered 2017-06-21: 2 mg via INTRAMUSCULAR
  Filled 2017-06-21: qty 5

## 2017-06-21 MED ORDER — MIDAZOLAM HCL 5 MG/5ML IJ SOLN
2.0000 mg | Freq: Once | INTRAMUSCULAR | Status: AC
  Administered 2017-06-21: 2 mg via INTRAMUSCULAR

## 2017-06-21 MED ORDER — DIPHENHYDRAMINE HCL 50 MG/ML IJ SOLN
25.0000 mg | Freq: Once | INTRAMUSCULAR | Status: AC
  Administered 2017-06-21: 25 mg via INTRAMUSCULAR

## 2017-06-21 MED ORDER — HALOPERIDOL LACTATE 5 MG/ML IJ SOLN
5.0000 mg | Freq: Once | INTRAMUSCULAR | Status: AC
  Administered 2017-06-21: 5 mg via INTRAMUSCULAR

## 2017-06-21 MED ORDER — DIPHENHYDRAMINE HCL 50 MG/ML IJ SOLN
INTRAMUSCULAR | Status: AC
  Administered 2017-06-21: 25 mg via INTRAMUSCULAR
  Filled 2017-06-21: qty 1

## 2017-06-21 NOTE — ED Provider Notes (Signed)
Patient is in no distress and currently improved.  She is stable for outpatient follow-up.   Rachel Mercer, [name], MD 06/21/17 [phone]

## 2017-06-21 NOTE — ED Triage Notes (Signed)
Pt bib [facility] with [employer] d/t panic attack. Pt was reportedly being transported to jail when seizure like activity and rapid respirations began in the car, EMS was called. Pt denies medical problems aside from anxiety, asthma.
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
## 2017-07-01 ENCOUNTER — Other Ambulatory Visit: Payer: Self-pay | Admitting: Family Medicine

## 2017-07-01 ENCOUNTER — Encounter: Payer: Self-pay | Admitting: Family Medicine

## 2017-07-01 ENCOUNTER — Ambulatory Visit (INDEPENDENT_AMBULATORY_CARE_PROVIDER_SITE_OTHER): Payer: 59 | Admitting: Family Medicine

## 2017-07-01 VITALS — BP 112/64 | HR 106 | Temp 99.2°F | Resp 16 | Wt 125.6 lb

## 2017-07-01 DIAGNOSIS — M545 Low back pain, unspecified: Secondary | ICD-10-CM

## 2017-07-01 MED ORDER — MELOXICAM 15 MG PO TABS: 15.0000 mg | ORAL_TABLET | Freq: Every day | ORAL | 0 refills | Status: DC

## 2017-07-01 NOTE — Progress Notes (Signed)
Subjective:     Patient ID: Rachel Mercer, female   DOB: March 20, 1996, 21 y.o.   MRN: [id number] Chief Complaint  Patient presents with  . Back Pain    Patient complains of lower back pain around her spine for the past two weeks. Patient describes pain as dull when sitting and sharp when movcing, she states that she feels a tightness in her back when bending back or forwards. Patient does not recall what could have triggered pain, she states that she has been taking prescription Ibuprofen and heated back strips for relief.    HPI States she is currently not working. Does not sleep consistently on the same mattress but states they are all "good" mattresses.  Review of Systems  Psychiatric/Behavioral:       States she sees her psychiatrist, Dr. Evelene CroonKaur, every 6 months. When asked about recent ER visit about a severe panic attack states she did not have access to her Xanax to abort the attack.       Objective:   Physical Exam  Constitutional: She appears well-developed and well-nourished. No distress.  Musculoskeletal:  Muscle strength in lower extremities 5/5. SLR's to 90 degrees without radiation of back pain. Localizes to lumbar vertebra with mild tenderness on palpation.  .     Assessment:    1. Acute midline low back pain without sciatica - meloxicam (MOBIC) 15 MG tablet; Take 1 tablet (15 mg total) by mouth daily.  Dispense: 30 tablet; Refill: 0    Plan:    Discussed use of heat and Tylenol. If not improving obtain LS spine x-ray.

## 2017-07-01 NOTE — Patient Instructions (Signed)
Discussed use of heating pad for 20 minutes several x day. May also use Tylenol up to 3000 mg/day.

## 2017-07-17 DIAGNOSIS — S0993XA Unspecified injury of face, initial encounter: Secondary | ICD-10-CM | POA: Diagnosis not present

## 2017-11-12 ENCOUNTER — Other Ambulatory Visit: Payer: Self-pay | Admitting: Family Medicine

## 2017-11-12 NOTE — Telephone Encounter (Signed)
Patient is requesting a refill on the following medication  albuterol (PROVENTIL HFA;VENTOLIN HFA) 108 (90 Base) MCG/ACT inhaler  [facility] [address]

## 2017-11-14 MED ORDER — ALBUTEROL SULFATE HFA 108 (90 BASE) MCG/ACT IN AERS
2.0000 | INHALATION_SPRAY | Freq: Four times a day (QID) | RESPIRATORY_TRACT | 2 refills | Status: DC | PRN
Start: 2017-11-14 — End: 2018-02-17

## 2018-01-22 ENCOUNTER — Other Ambulatory Visit: Payer: Self-pay | Admitting: Family Medicine

## 2018-01-22 DIAGNOSIS — F32A Depression, unspecified: Secondary | ICD-10-CM

## 2018-01-22 DIAGNOSIS — F329 Major depressive disorder, single episode, unspecified: Secondary | ICD-10-CM

## 2018-01-29 ENCOUNTER — Encounter: Payer: Self-pay | Admitting: Emergency Medicine

## 2018-01-29 ENCOUNTER — Emergency Department: Payer: 59

## 2018-01-29 ENCOUNTER — Emergency Department
Admission: EM | Admit: 2018-01-29 | Discharge: 2018-01-29 | Disposition: A | Discharge status: Home / Self Care | Payer: 59 | Attending: Emergency Medicine | Admitting: Emergency Medicine

## 2018-01-29 DIAGNOSIS — J45909 Unspecified asthma, uncomplicated: Secondary | ICD-10-CM | POA: Diagnosis not present

## 2018-01-29 DIAGNOSIS — Y9389 Activity, other specified: Secondary | ICD-10-CM | POA: Diagnosis not present

## 2018-01-29 DIAGNOSIS — R51 Headache: Secondary | ICD-10-CM | POA: Diagnosis not present

## 2018-01-29 DIAGNOSIS — S80211A Abrasion, right knee, initial encounter: Secondary | ICD-10-CM | POA: Diagnosis not present

## 2018-01-29 DIAGNOSIS — S299XXA Unspecified injury of thorax, initial encounter: Secondary | ICD-10-CM | POA: Diagnosis not present

## 2018-01-29 DIAGNOSIS — Y92410 Unspecified street and highway as the place of occurrence of the external cause: Secondary | ICD-10-CM | POA: Insufficient documentation

## 2018-01-29 DIAGNOSIS — Y999 Unspecified external cause status: Secondary | ICD-10-CM | POA: Insufficient documentation

## 2018-01-29 DIAGNOSIS — S0990XA Unspecified injury of head, initial encounter: Secondary | ICD-10-CM | POA: Diagnosis not present

## 2018-01-29 DIAGNOSIS — R6884 Jaw pain: Secondary | ICD-10-CM | POA: Diagnosis not present

## 2018-01-29 DIAGNOSIS — R079 Chest pain, unspecified: Secondary | ICD-10-CM | POA: Diagnosis not present

## 2018-01-29 DIAGNOSIS — S8991XA Unspecified injury of right lower leg, initial encounter: Secondary | ICD-10-CM | POA: Diagnosis present

## 2018-01-29 DIAGNOSIS — T148XXA Other injury of unspecified body region, initial encounter: Secondary | ICD-10-CM

## 2018-01-29 DIAGNOSIS — S0993XA Unspecified injury of face, initial encounter: Secondary | ICD-10-CM | POA: Diagnosis not present

## 2018-01-29 LAB — URINE DRUG SCREEN, QUALITATIVE (ARMC ONLY)
Amphetamines, Ur Screen: NOT DETECTED
Benzodiazepine, Ur Scrn: POSITIVE — AB
CANNABINOID 50 NG, UR ~~LOC~~: POSITIVE — AB
COCAINE METABOLITE, UR ~~LOC~~: NOT DETECTED
MDMA (Ecstasy)Ur Screen: NOT DETECTED
Methadone Scn, Ur: NOT DETECTED
Opiate, Ur Screen: NOT DETECTED
Phencyclidine (PCP) Ur S: NOT DETECTED
TRICYCLIC, UR SCREEN: NOT DETECTED

## 2018-01-29 LAB — POC URINE PREG, ED: Preg Test, Ur: NEGATIVE

## 2018-01-29 MED ORDER — IBUPROFEN 800 MG PO TABS
800.0000 mg | ORAL_TABLET | Freq: Once | ORAL | Status: AC
  Administered 2018-01-29: 800 mg via ORAL
  Filled 2018-01-29: qty 1

## 2018-01-29 MED ORDER — IBUPROFEN 800 MG PO TABS: 800.0000 mg | ORAL_TABLET | Freq: Three times a day (TID) | ORAL | 0 refills | Status: DC | PRN

## 2018-01-29 NOTE — ED Notes (Signed)
Pt to xray at this time.

## 2018-01-29 NOTE — ED Notes (Addendum)
Pt resting. Respirations even and unlabored. NAD. Stretcher in low and locked position. Call bell in reach. Denies needs at this time. Family at bedside. Pt denies needing more pain meds at this time. Will continue to monitor.
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
## 2018-01-29 NOTE — ED Triage Notes (Signed)
Pt c/o jaw pain and right knee pain.
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
## 2018-01-29 NOTE — ED Notes (Signed)
Pt to CT at this time.

## 2018-02-06 DIAGNOSIS — R194 Change in bowel habit: Secondary | ICD-10-CM | POA: Diagnosis not present

## 2018-02-06 DIAGNOSIS — R1013 Epigastric pain: Secondary | ICD-10-CM | POA: Diagnosis not present

## 2018-02-06 DIAGNOSIS — K30 Functional dyspepsia: Secondary | ICD-10-CM | POA: Diagnosis not present

## 2018-02-17 ENCOUNTER — Other Ambulatory Visit: Payer: Self-pay | Admitting: Family Medicine

## 2018-02-17 ENCOUNTER — Telehealth: Payer: Self-pay

## 2018-02-17 MED ORDER — ALBUTEROL SULFATE HFA 108 (90 BASE) MCG/ACT IN AERS
2.0000 | INHALATION_SPRAY | Freq: Four times a day (QID) | RESPIRATORY_TRACT | 0 refills | Status: DC | PRN
Start: 2018-02-17 — End: 2018-03-17

## 2018-02-17 NOTE — Telephone Encounter (Signed)
done

## 2018-02-17 NOTE — Telephone Encounter (Signed)
Please review. KW 

## 2018-02-17 NOTE — Telephone Encounter (Signed)
Patient is requesting a refill on [facility] [address] CB# [phone]

## 2018-03-17 ENCOUNTER — Other Ambulatory Visit: Payer: Self-pay | Admitting: Family Medicine

## 2018-04-18 ENCOUNTER — Ambulatory Visit: Payer: 59 | Admitting: Physician Assistant

## 2018-04-18 ENCOUNTER — Encounter: Payer: Self-pay | Admitting: Physician Assistant

## 2018-04-18 VITALS — BP 100/60 | HR 92 | Temp 98.4°F | Resp 16 | Wt 133.6 lb

## 2018-04-18 DIAGNOSIS — R1031 Right lower quadrant pain: Secondary | ICD-10-CM

## 2018-04-18 DIAGNOSIS — N912 Amenorrhea, unspecified: Secondary | ICD-10-CM

## 2018-04-18 DIAGNOSIS — Z3200 Encounter for pregnancy test, result unknown: Secondary | ICD-10-CM

## 2018-04-18 LAB — POCT URINE PREGNANCY: Preg Test, Ur: NEGATIVE

## 2018-04-18 NOTE — Progress Notes (Signed)
Patient: Rachel Mercer Female    DOB: Aug 31, 1995   22 y.o.   MRN: [id number] Visit Date: 04/18/2018  Today's Provider: Margaretann Loveless, PA-C   Chief Complaint  Patient presents with  . Possible Pregnancy   Subjective:    HPI Patient here here that she might be pregnant. LMP:03/06/18. She reports that she is not having abdominal pain. She reports she spotted for two days the end of last month. Reports that the end of last month she had some bleeding the  first day she had a heavy period, and severe pain with it and then got better the next day. She took a pregnancy test at home the end of last week and it was negative. Reports her cycle is regular. She reports that She has not taken her ocp since December.    No Known Allergies   Current Outpatient Medications:  .  ALPRAZolam (XANAX) 1 MG tablet, Take 1 mg by mouth daily as needed for anxiety. , Disp: , Rfl: 0 .  clonazePAM (KLONOPIN) 1 MG tablet, take 1 tablet by mouth twice a day if needed, Disp: , Rfl: 0 .  omeprazole (PRILOSEC) 20 MG capsule, Take 20 mg by mouth daily. , Disp: , Rfl:  .  valACYclovir (VALTREX) 500 MG tablet, TAKE 1 TABLET BY MOUTH ONCE DAILY, Disp: 30 tablet, Rfl: 0 .  venlafaxine XR (EFFEXOR-XR) 75 MG 24 hr capsule, TAKE 1 CAPSULE BY MOUTH  DAILY WITH BREAKFAST, Disp: 90 capsule, Rfl: 3 .  VENTOLIN HFA 108 (90 Base) MCG/ACT inhaler, TAKE 2 PUFFS BY MOUTH EVERY 6 HOURS AS NEEDED FOR WHEEZE OR SHORTNESS OF BREATH, Disp: 18 Inhaler, Rfl: 0 .  ibuprofen (ADVIL,MOTRIN) 800 MG tablet, Take 1 tablet (800 mg total) by mouth every 8 (eight) hours as needed. (Patient not taking: Reported on 04/18/2018), Disp: 30 tablet, Rfl: 0 .  levonorgestrel-ethinyl estradiol (SEASONALE,INTROVALE,JOLESSA) 0.15-0.03 MG tablet, TAKE 1 TABLET BY MOUTH ONCE DAILY (Patient not taking: Reported on 04/18/2018), Disp: 30 tablet, Rfl: 0 .  ranitidine (ZANTAC) 150 MG capsule, Take 150 mg by mouth daily. , Disp: , Rfl:   Review of  Systems  Cardiovascular: Negative for chest pain, palpitations and leg swelling.  Gastrointestinal: Positive for abdominal pain (RLQ now improved) and constipation. Negative for diarrhea and nausea.  Genitourinary: Positive for dyspareunia and menstrual problem. Negative for dysuria, flank pain, frequency, genital sores, hematuria, pelvic pain, vaginal bleeding, vaginal discharge and vaginal pain.    Social History   Tobacco Use  . Smoking status: Never Smoker  . Smokeless tobacco: Never Used  Substance Use Topics  . Alcohol use: Yes    Alcohol/week: 0.0 - 3.0 standard drinks    Comment: occasional   Objective:   BP 100/60 (BP Location: Left Arm, Patient Position: Sitting, Cuff Size: Normal)   Pulse 92   Temp 98.4 F (36.9 C) (Oral)   Resp 16   Wt 133 lb 9.6 oz (60.6 kg)   SpO2 98%   BMI 22.23 kg/m  Vitals:   04/18/18 0944  BP: 100/60  Pulse: 92  Resp: 16  Temp: 98.4 F (36.9 C)  TempSrc: Oral  SpO2: 98%  Weight: 133 lb 9.6 oz (60.6 kg)     Physical Exam  Constitutional: She is oriented to person, place, and time. She appears well-developed and well-nourished. No distress.  Cardiovascular: Normal rate, regular rhythm and normal heart sounds. Exam reveals no gallop and no friction rub.  No murmur heard.  Pulmonary/Chest: Effort normal and breath sounds normal. No respiratory distress. She has no wheezes. She has no rales.  Abdominal: Soft. Normal appearance and bowel sounds are normal. She exhibits no distension and no mass. There is no hepatosplenomegaly. There is no tenderness. There is no rebound, no guarding and no CVA tenderness.  Neurological: She is alert and oriented to person, place, and time.  Skin: Skin is warm and dry. She is not diaphoretic.  Vitals reviewed.      Assessment & Plan:     1. Possible pregnancy Urine pregnancy negative.  - POCT urine pregnancy  2. Abdominal pain, RLQ Suspect possible ovarian cyst. Will get imaging as below. I will f/u  pending results.  - US Pelvis Complete; Future - US Transvaginal Non-OB; Future  3. Amenorrhea See above medical treatment plan. - US Pelvis Complete; Future - US Transvaginal Non-OB; Future       Margaretann LovelessJennifer M Burnette, PA-C  [facility]

## 2018-04-18 NOTE — Patient Instructions (Signed)
Ovarian Cyst An ovarian cyst is a fluid-filled sac on an ovary. The ovaries are organs that make eggs in women. Most ovarian cysts go away on their own and are not cancerous (are benign). Some cysts need treatment. Follow these instructions at home:  Take over-the-counter and prescription medicines only as told by your doctor.  Do not drive or use heavy machinery while taking prescription pain medicine.  Get pelvic exams and Pap tests as often as told by your doctor.  Return to your normal activities as told by your doctor. Ask your doctor what activities are safe for you.  Do not use any products that contain nicotine or tobacco, such as cigarettes and e-cigarettes. If you need help quitting, ask your doctor.  Keep all follow-up visits as told by your doctor. This is important. Contact a doctor if:  Your periods are: ? Late. ? Irregular. ? Painful.  Your periods stop.  You have pelvic pain that does not go away.  You have pressure on your bladder.  You have trouble making your bladder empty when you pee (urinate).  You have pain during sex.  You have any of the following in your belly (abdomen): ? A feeling of fullness. ? Pressure. ? Discomfort. ? Pain that does not go away. ? Swelling.  You feel sick most of the time.  You have trouble pooping (have constipation).  You are not as hungry as usual (you lose your appetite).  You get very bad acne.  You start to have more hair on your body and face.  You are gaining weight or losing weight without changing your exercise and eating habits.  You think you may be pregnant. Get help right away if:  You have belly pain that is very bad or gets worse.  You cannot eat or drink without throwing up (vomiting).  You suddenly get a fever.  Your period is a lot heavier than usual. This information is not intended to replace advice given to you by your health care provider. Make sure you discuss any questions you have  with your health care provider. Document Released: 01/02/2008 Document Revised: 02/03/2016 Document Reviewed: 12/18/2015 Elsevier Interactive Patient Education  2018 [employer]  

## 2018-04-23 ENCOUNTER — Ambulatory Visit: Payer: 59

## 2018-04-25 ENCOUNTER — Ambulatory Visit
Admission: RE | Admit: 2018-04-25 | Discharge: 2018-04-25 | Disposition: A | Discharge status: Home / Self Care | Payer: 59 | Source: Ambulatory Visit | Attending: Physician Assistant | Admitting: Physician Assistant

## 2018-04-25 DIAGNOSIS — N912 Amenorrhea, unspecified: Secondary | ICD-10-CM | POA: Diagnosis not present

## 2018-04-25 DIAGNOSIS — N83201 Unspecified ovarian cyst, right side: Secondary | ICD-10-CM | POA: Diagnosis not present

## 2018-04-25 DIAGNOSIS — R1013 Epigastric pain: Secondary | ICD-10-CM | POA: Diagnosis not present

## 2018-04-25 DIAGNOSIS — N83291 Other ovarian cyst, right side: Secondary | ICD-10-CM | POA: Diagnosis not present

## 2018-04-25 DIAGNOSIS — R1031 Right lower quadrant pain: Secondary | ICD-10-CM

## 2018-04-28 ENCOUNTER — Telehealth: Payer: Self-pay | Admitting: Family Medicine

## 2018-04-28 NOTE — Telephone Encounter (Signed)
French Ana is calling to give report on pt's ultrasound.  Please call French Ana back for results.  Thanks, [employer]

## 2018-04-28 NOTE — Telephone Encounter (Signed)
LMTCB. Results and result note released to My Chart.  

## 2018-04-28 NOTE — Telephone Encounter (Signed)
Pt advised.   Thanks,   -Laura  

## 2018-04-28 NOTE — Telephone Encounter (Signed)
-----   Message from Margaretann Loveless, [place] sent at 04/26/2018  9:17 AM EDT ----- There is an ovarian cyst noted in the right ovary. It is recommended to get a repeat US in 6-12 weeks to make sure the cyst resolves on its own as it should.

## 2018-04-28 NOTE — Telephone Encounter (Signed)
I think Victorino Dike ordered this test and should see it.

## 2018-04-28 NOTE — Telephone Encounter (Signed)
Pt returned call ° °teri °

## 2018-05-23 ENCOUNTER — Other Ambulatory Visit: Payer: Self-pay | Admitting: Family Medicine

## 2018-05-23 ENCOUNTER — Telehealth: Payer: Self-pay | Admitting: Family Medicine

## 2018-05-23 DIAGNOSIS — R1031 Right lower quadrant pain: Secondary | ICD-10-CM

## 2018-05-23 NOTE — Telephone Encounter (Signed)
Pt wanting to go ahead with scheduling the ultra sound for the ovarian issues she was seen for by [facility] on 04-18-18.  Please advise pt.  Thanks, [employer]

## 2018-05-23 NOTE — Telephone Encounter (Signed)
Scheduled pelvic ultrasound for 6 week follow up of complex right ovarian cyst.

## 2018-05-26 NOTE — Telephone Encounter (Signed)
Patient was advised via vm per DPR.,PC 

## 2018-05-27 ENCOUNTER — Other Ambulatory Visit: Payer: Self-pay | Admitting: Family Medicine

## 2018-05-27 DIAGNOSIS — R1031 Right lower quadrant pain: Secondary | ICD-10-CM

## 2018-06-03 ENCOUNTER — Ambulatory Visit
Admission: RE | Admit: 2018-06-03 | Discharge: 2018-06-03 | Disposition: A | Discharge status: Home / Self Care | Payer: 59 | Source: Ambulatory Visit | Attending: Family Medicine | Admitting: Family Medicine

## 2018-06-03 ENCOUNTER — Other Ambulatory Visit: Payer: Self-pay | Admitting: Family Medicine

## 2018-06-03 DIAGNOSIS — N83291 Other ovarian cyst, right side: Secondary | ICD-10-CM | POA: Diagnosis not present

## 2018-06-03 DIAGNOSIS — R1031 Right lower quadrant pain: Secondary | ICD-10-CM | POA: Diagnosis not present

## 2018-06-05 NOTE — Progress Notes (Signed)
I ordered this follow up ultrasound while you were gone. Looks like the complex cyst resolved.

## 2018-06-06 ENCOUNTER — Telehealth: Payer: Self-pay

## 2018-06-06 NOTE — Telephone Encounter (Signed)
-----   Message from Margaretann Loveless, [place] sent at 06/05/2018  3:34 PM EST ----- Repeat US shows resolution of the complex cyst which is to be expected.

## 2018-06-06 NOTE — Telephone Encounter (Signed)
LMTCB

## 2018-06-09 NOTE — Telephone Encounter (Signed)
LMTCB or to view message and results through my chart.

## 2018-10-17 DIAGNOSIS — Z3490 Encounter for supervision of normal pregnancy, unspecified, unspecified trimester: Secondary | ICD-10-CM | POA: Insufficient documentation

## 2018-10-21 LAB — ABORH (OUTSIDE ORGANIZATION)

## 2018-11-29 ENCOUNTER — Other Ambulatory Visit: Payer: Self-pay | Admitting: Family Medicine

## 2018-11-29 DIAGNOSIS — F329 Major depressive disorder, single episode, unspecified: Secondary | ICD-10-CM

## 2018-11-29 DIAGNOSIS — F32A Depression, unspecified: Secondary | ICD-10-CM

## 2018-12-15 ENCOUNTER — Other Ambulatory Visit: Payer: Self-pay | Admitting: Family Medicine

## 2018-12-15 DIAGNOSIS — F329 Major depressive disorder, single episode, unspecified: Secondary | ICD-10-CM

## 2018-12-15 DIAGNOSIS — F32A Depression, unspecified: Secondary | ICD-10-CM

## 2019-03-25 ENCOUNTER — Other Ambulatory Visit: Payer: Self-pay | Admitting: Family Medicine

## 2019-03-25 DIAGNOSIS — J4520 Mild intermittent asthma, uncomplicated: Secondary | ICD-10-CM

## 2019-03-25 MED ORDER — ALBUTEROL SULFATE HFA 108 (90 BASE) MCG/ACT IN AERS: INHALATION_SPRAY | RESPIRATORY_TRACT | 0 refills | Status: DC

## 2019-07-05 ENCOUNTER — Other Ambulatory Visit: Payer: Self-pay | Admitting: Family Medicine

## 2019-07-05 DIAGNOSIS — J4520 Mild intermittent asthma, uncomplicated: Secondary | ICD-10-CM

## 2019-07-05 MED ORDER — ALBUTEROL SULFATE HFA 108 (90 BASE) MCG/ACT IN AERS: INHALATION_SPRAY | RESPIRATORY_TRACT | 0 refills | Status: DC

## 2019-09-05 IMAGING — US US PELVIS COMPLETE
1 series · 14 of 25 positions shown · non-contrast
Comparison: 04/25/2018

CLINICAL DATA: Follow-up complex RIGHT ovarian cyst

EXAM:
TRANSABDOMINAL ULTRASOUND OF PELVIS
TECHNIQUE: Transabdominal ultrasound examination of the pelvis was performed
including evaluation of the uterus, ovaries, adnexal regions, and
pelvic cul-de-sac. Transvaginal imaging declined by patient.

[Series 1: us pelvis complete · 0.16mm/px · 14 of 50 slices shown]
[im 1/50]
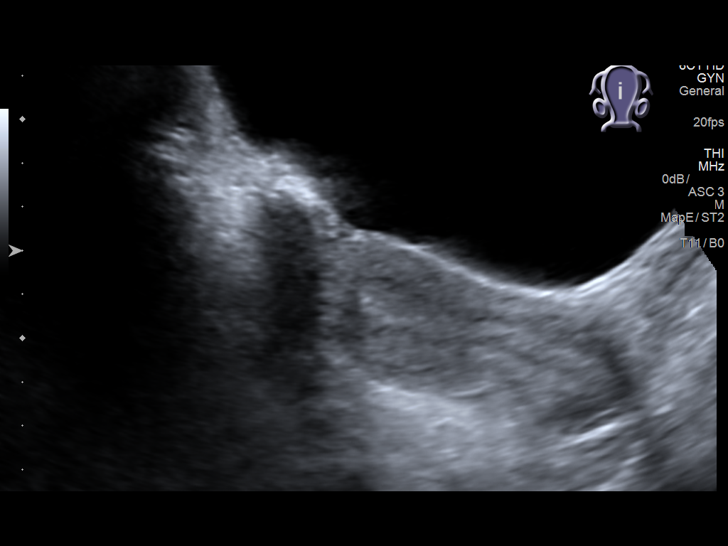
[im 5/50]
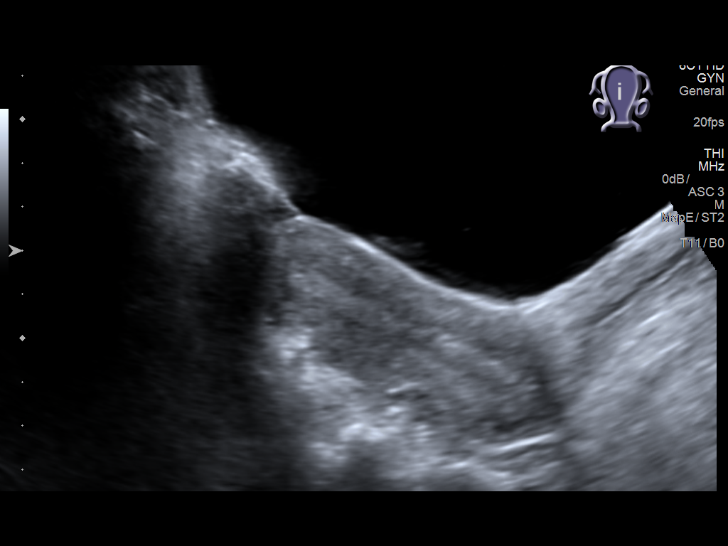
[im 9/50]
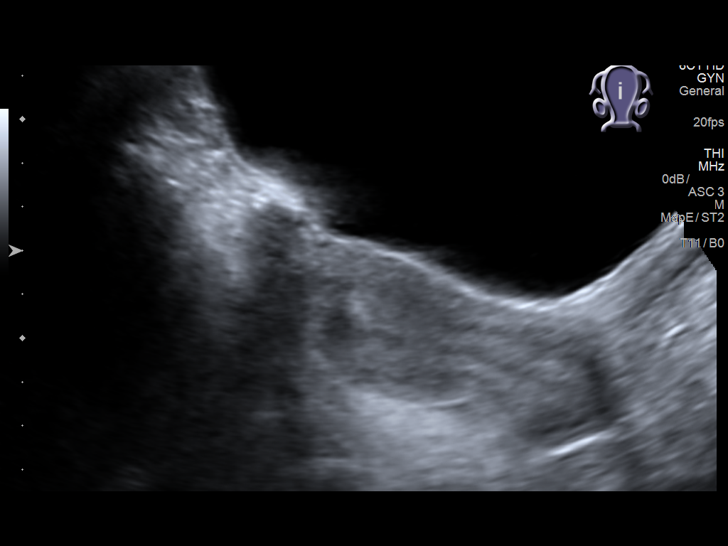
[im 13/50]
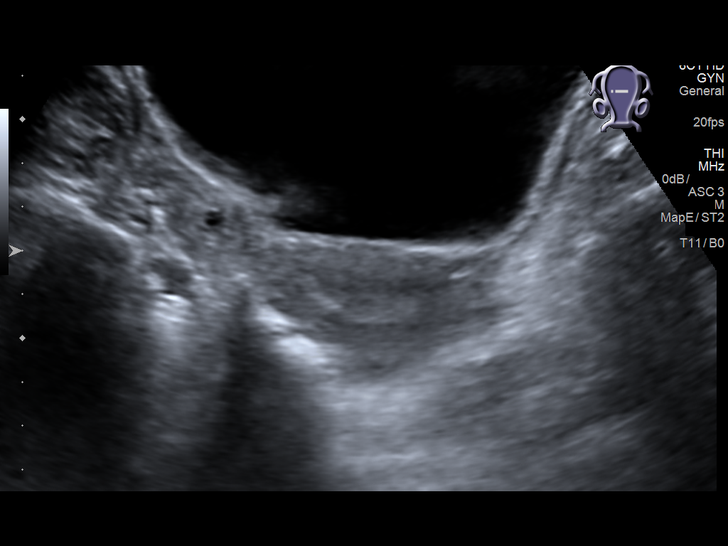
[im 17/50]
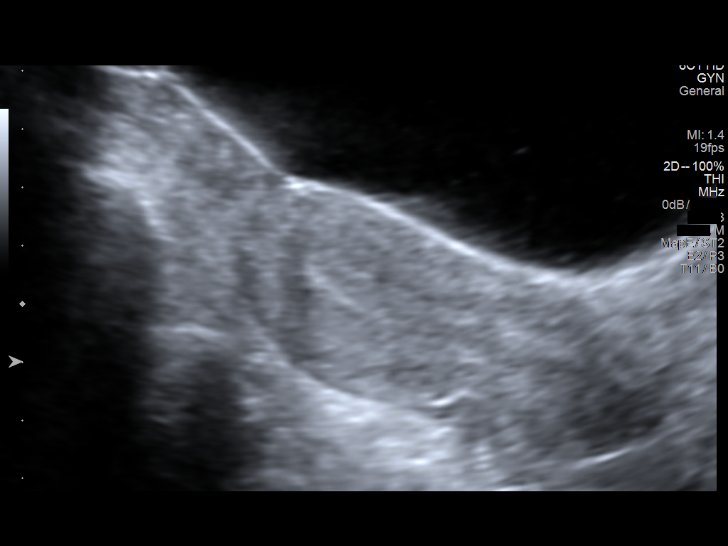
[im 19/50]
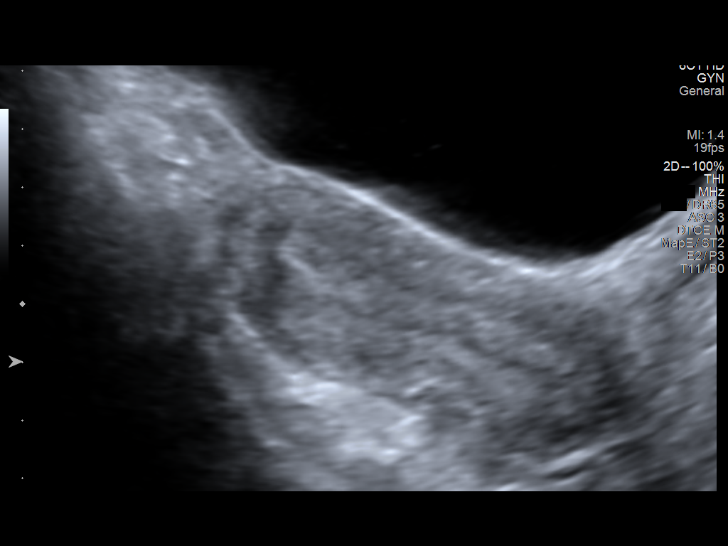
[im 23/50]
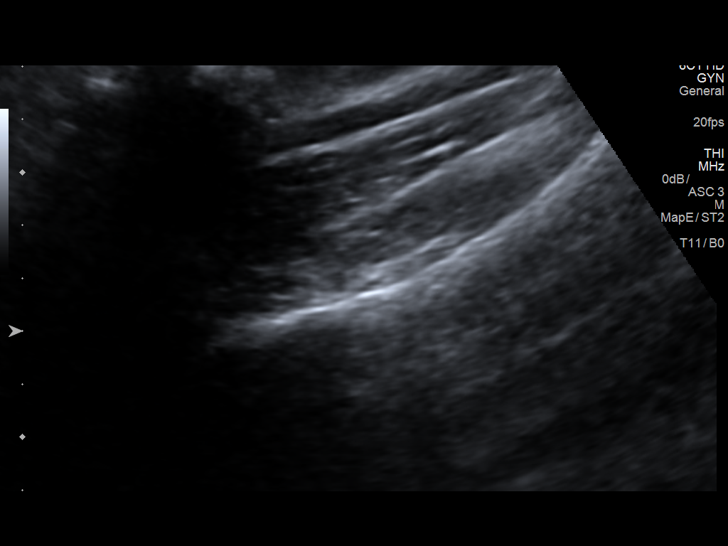
[im 27/50]
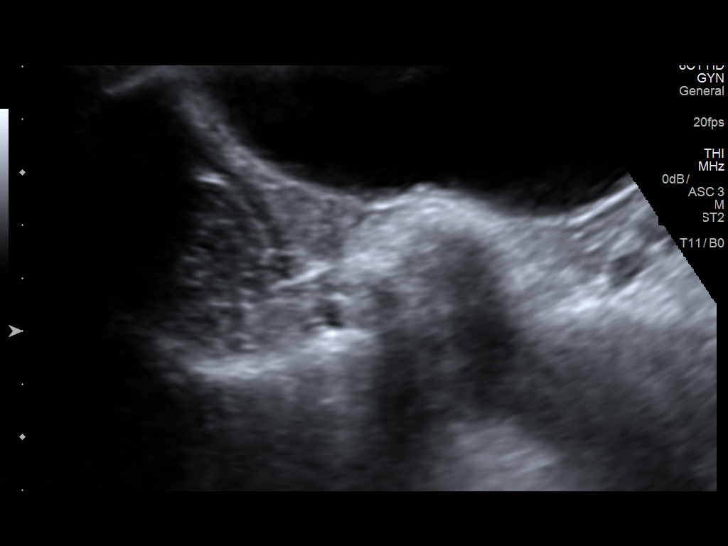
[im 31/50]
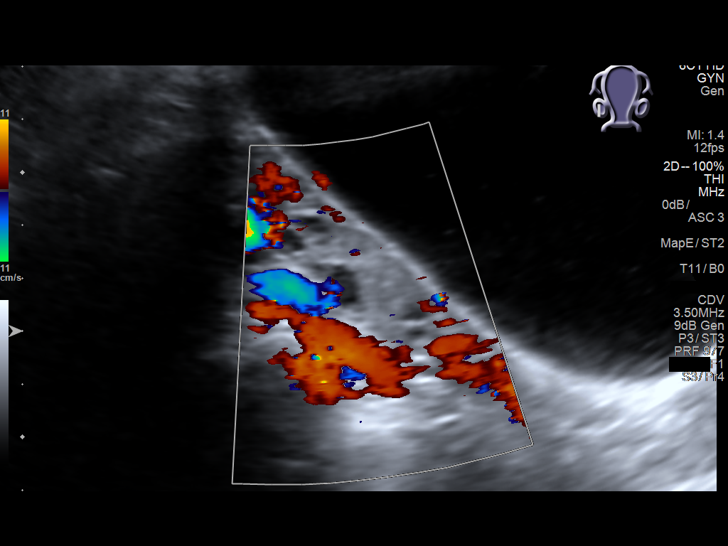
[im 33/50]
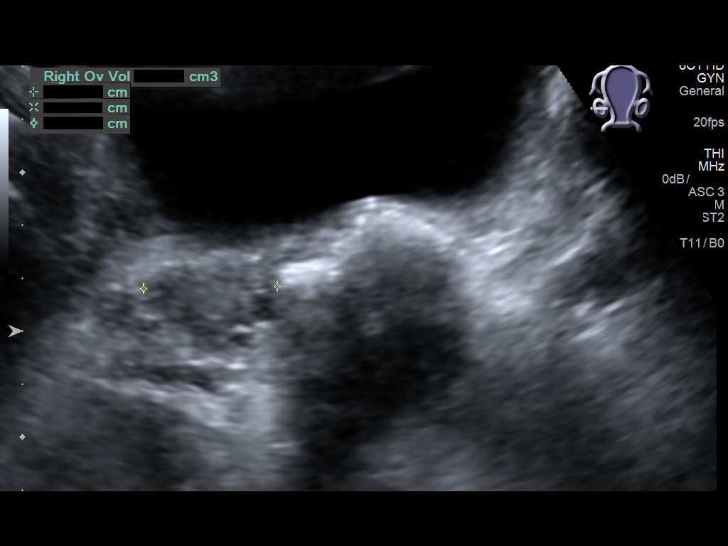
[im 37/50]
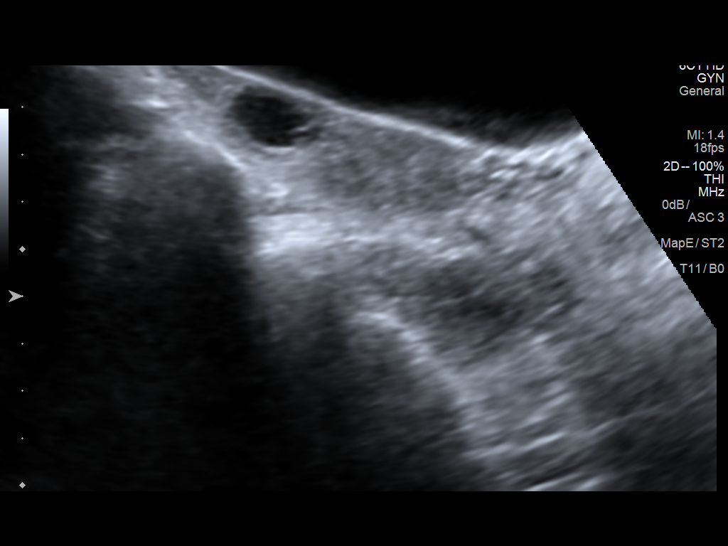
[im 41/50]
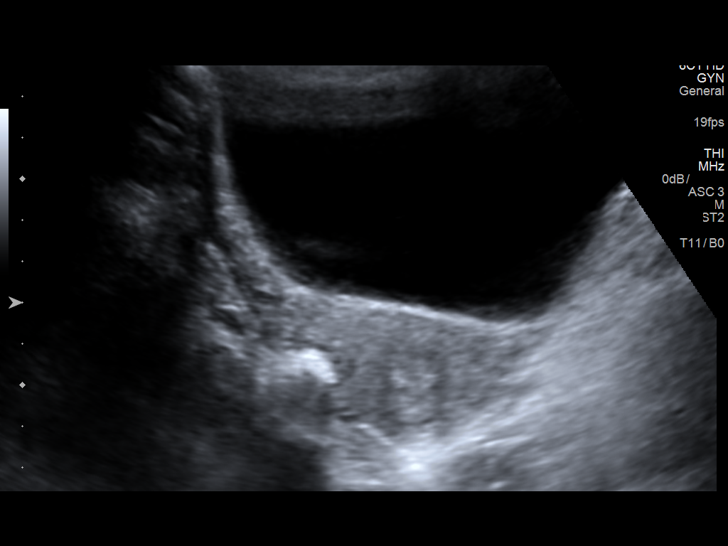
[im 45/50]
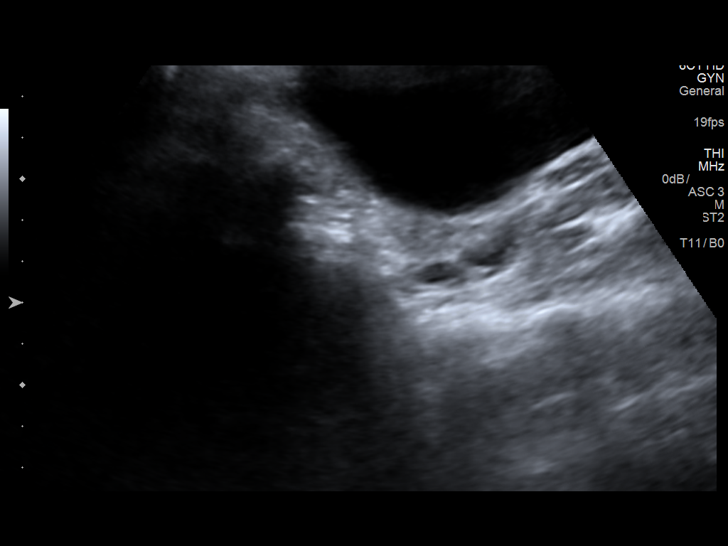
[im 50/50]
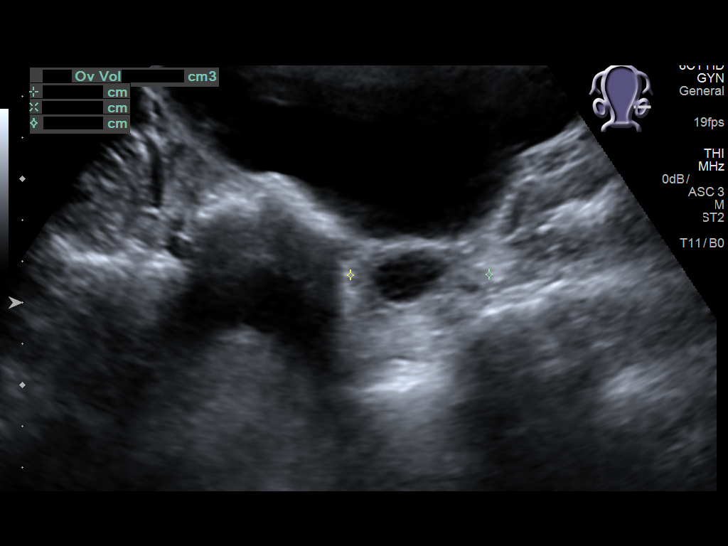

[14 of 25 positions shown; findings below may reference images not displayed]

FINDINGS: Uterus

Measurements: 7.3 x 3.2 x 4.6 cm = volume: 55.9 mL. Normal
morphology without mass

Endometrium

Thickness: 8 mm.  No endometrial fluid or focal abnormality

Right ovary

Measurements: 3.9 x 1.6 x 2.5 cm = volume: 8.1 mL. Normal morphology
without mass. Specifically, 3.7 cm diameter complicated cystic
lesion seen at the RIGHT ovary on the previous exam is not
definitely in visualized by current transabdominal imaging; this
lesion was seen on the prior exam on both transabdominal and
endovaginal images.

Left ovary

Measurements: 4.5 x 1.8 x 3.4 cm = volume: 14.6 mL. Dominant
follicle without additional mass

Other findings:  No free pelvic fluid or adnexal masses seen.
IMPRESSION: Normal transabdominal ultrasound of the pelvis.

Complicated cystic lesion of the RIGHT ovary seen on the previous
exam no longer identified.

## 2020-05-05 LAB — HEPATITIS C ANTIBODY: HCV Ab: NEGATIVE

## 2020-05-05 LAB — OB RESULTS CONSOLE HGB/HCT, BLOOD
HCT: 38 (ref 29–41)
Hemoglobin: 13.1

## 2020-05-05 LAB — OB RESULTS CONSOLE RPR: RPR: NONREACTIVE

## 2020-05-05 LAB — OB RESULTS CONSOLE HIV ANTIBODY (ROUTINE TESTING): HIV: NONREACTIVE

## 2020-05-05 LAB — OB RESULTS CONSOLE RUBELLA ANTIBODY, IGM: Rubella: IMMUNE

## 2020-05-05 LAB — OB RESULTS CONSOLE HEPATITIS B SURFACE ANTIGEN: Hepatitis B Surface Ag: NEGATIVE

## 2020-05-05 LAB — OB RESULTS CONSOLE ANTIBODY SCREEN: Antibody Screen: NEGATIVE

## 2020-05-05 LAB — OB RESULTS CONSOLE TSH: TSH: 1.18

## 2020-05-05 LAB — OB RESULTS CONSOLE ABO/RH: RH Type: POSITIVE

## 2020-05-05 LAB — OB RESULTS CONSOLE TB SKIN TEST: CHL TB SkinTest: NEGATIVE

## 2020-05-05 LAB — OB RESULTS CONSOLE PLATELET COUNT: Platelets: 321

## 2020-05-13 DIAGNOSIS — O0990 Supervision of high risk pregnancy, unspecified, unspecified trimester: Secondary | ICD-10-CM | POA: Insufficient documentation

## 2020-06-29 LAB — OB RESULTS CONSOLE GC/CHLAMYDIA
Chlamydia: NEGATIVE
Gonorrhea: NEGATIVE

## 2020-07-30 NOTE — L&D Delivery Note (Signed)
       Delivery Note   KEILI HASTEN is a 25 y.o. G3P0020 at [redacted]w[redacted]d Estimated Date of Delivery: 11/14/20  PRE-OPERATIVE DIAGNOSIS:  1) [redacted]w[redacted]d pregnancy.  2) Anxiety 3) Labor   POST-OPERATIVE DIAGNOSIS:  1) [redacted]w[redacted]d pregnancy s/p Vaginal, Spontaneous  2) Same with viable female infant  Delivery Type: Vaginal, Spontaneous    Delivery Anesthesia: None   Labor Complications:      ESTIMATED BLOOD LOSS: 120  ml    FINDINGS:   1) female infant, Apgar scores of 8   at 1 minute and 9   at 5 minutes and a birthweight of   ounces.    2) Nuchal cord: Yes LE cord  SPECIMENS:   PLACENTA:   Appearance: Intact    Removal: Spontaneous      Disposition:    DISPOSITION:  Infant to left in stable condition in the delivery room, with L&D personnel and mother,  NARRATIVE SUMMARY: Labor course:  Ms. FINA HEIZER is a G3P0020 at [redacted]w[redacted]d who presented for labor management.  She progressed rapidly to complete dilation. AROM - clear. She evidenced good maternal expulsive effort during the second stage. She went on to deliver a viable infant. The placenta delivered without problems and was noted to be complete. A perineal and vaginal examination was performed. Episiotomy/Lacerations: Labial (left) Lacerations were repaired with Vicryl suture using local anesthesia. 3  4-0 tacking sutures The patient tolerated this well.  Elonda Husky, M.D. 11/12/2020 6:53 AM

## 2020-08-10 ENCOUNTER — Observation Stay
Admission: AD | Admit: 2020-08-10 | Discharge: 2020-08-10 | Disposition: A | Discharge status: Home / Self Care | Payer: BLUE CROSS/BLUE SHIELD | Attending: OBSTETRICS/GYN | Admitting: OBSTETRICS/GYN

## 2020-08-10 ENCOUNTER — Encounter: Payer: Self-pay | Admitting: OBSTETRICS/GYN

## 2020-08-10 DIAGNOSIS — E860 Dehydration: Secondary | ICD-10-CM | POA: Insufficient documentation

## 2020-08-10 DIAGNOSIS — F410 Panic disorder [episodic paroxysmal anxiety] without agoraphobia: Secondary | ICD-10-CM | POA: Insufficient documentation

## 2020-08-10 DIAGNOSIS — O212 Late vomiting of pregnancy: Principal | ICD-10-CM | POA: Insufficient documentation

## 2020-08-10 DIAGNOSIS — R000 Tachycardia, unspecified: Secondary | ICD-10-CM

## 2020-08-10 DIAGNOSIS — E871 Hypo-osmolality and hyponatremia: Secondary | ICD-10-CM | POA: Insufficient documentation

## 2020-08-10 DIAGNOSIS — R824 Acetonuria: Secondary | ICD-10-CM | POA: Insufficient documentation

## 2020-08-10 DIAGNOSIS — E876 Hypokalemia: Secondary | ICD-10-CM | POA: Insufficient documentation

## 2020-08-10 DIAGNOSIS — O26892 Other specified pregnancy related conditions, second trimester: Secondary | ICD-10-CM | POA: Insufficient documentation

## 2020-08-10 DIAGNOSIS — D72829 Elevated white blood cell count, unspecified: Secondary | ICD-10-CM | POA: Insufficient documentation

## 2020-08-10 DIAGNOSIS — O99282 Endocrine, nutritional and metabolic diseases complicating pregnancy, second trimester: Secondary | ICD-10-CM

## 2020-08-10 DIAGNOSIS — Z3A26 26 weeks gestation of pregnancy: Secondary | ICD-10-CM | POA: Insufficient documentation

## 2020-08-10 HISTORY — DX: Mental disorder, not otherwise specified: F99

## 2020-08-10 HISTORY — DX: Unspecified asthma, uncomplicated: J45.909

## 2020-08-10 LAB — POC URINALYSIS-DIPSTICK
POC BILIRUBIN URINE: NEGATIVE
POC GLUCOSE URINE: NEGATIVE mg/dL
POC KETONES: NEGATIVE
POC LEUK. ESTERASE: NEGATIVE
POC LEUK. ESTERASE: NEGATIVE
POC NITRITE URINE: NEGATIVE
POC NITRITE URINE: NEGATIVE
POC OCCULT BLOOD URINE: NEGATIVE
POC OCCULT BLOOD URINE: NEGATIVE
POC PH URINALYSIS: 7.5 pH (ref 4.8–7.8)
POC PH URINALYSIS: 8.5 pH — AB (ref 4.8–7.8)
POC PROTEIN URINE: 100 — AB
POC PROTEIN URINE: NEGATIVE
POC SP GRAVITY: 1.015 (ref 1.002–1.030)
POC SP GRAVITY: 1.015 (ref 1.002–1.030)
POC UROBILINOGEN: 1 EU/dL
POC UROBILINOGEN: 0.2 EU/dL

## 2020-08-10 LAB — TYPE AND SCREEN
Ab Scrn-Gel: NEGATIVE
Patient Blood Type: A POS

## 2020-08-10 LAB — CBC WITH DIFFERENTIAL
Basophils % Auto: 1 %
Basophils Abs Auto: 0.2 10*3/uL (ref 0.0–0.2)
Eosinophils % Auto: 0.5 %
Eosinophils Abs Auto: 0.1 10*3/uL (ref 0.0–0.5)
Hematocrit: 38.6 % (ref 34.0–46.0)
Hemoglobin: 13.3 g/dL (ref 12.0–16.0)
Lymphocytes % Auto: 10.3 %
Lymphocytes Abs Auto: 1.9 10*3/uL (ref 1.0–4.8)
MCH: 32.3 pg (ref 27.0–33.0)
MCHC: 34.4 % (ref 32.0–36.0)
MCV: 93.7 fL (ref 80.0–100.0)
MPV: 8 fL (ref 6.8–10.0)
Monocytes % Auto: 8.5 %
Monocytes Abs Auto: 1.6 10*3/uL — ABNORMAL HIGH (ref 0.1–0.8)
Neutrophils % Auto: 79.7 %
Neutrophils Abs Auto: 14.8 10*3/uL — ABNORMAL HIGH (ref 1.8–7.7)
Platelet Count: 325 10*3/uL (ref 130–400)
RDW: 12.7 % (ref 0.0–14.7)
Red Blood Cell Count: 4.12 10*6/uL (ref 3.70–5.50)
White Blood Cell Count: 18.6 10*3/uL — ABNORMAL HIGH (ref 4.5–11.0)

## 2020-08-10 LAB — URINALYSIS-COMPLETE
Bilirubin Urine: NEGATIVE
Glucose Urine: NEGATIVE mg/dL
Hyaline Casts: 1 /LPF (ref 0–2)
Ketones: 80 mg/dL — AB
Leuk Esterase: NEGATIVE
Nitrite Urine: NEGATIVE
Occult Blood Urine: NEGATIVE mg/dL
Protein Urine: 100 mg/dL — AB
RBC: 1 /[HPF] (ref 0–2)
Specific Gravity, Urine: 1.026 (ref 1.002–1.030)
Squamous EPI: 13 /[HPF] — ABNORMAL HIGH (ref <=10)
Urobilinogen: 2 mg/dL (ref ?–2.0)
WBC, Urine: 5 /[HPF] (ref 0–5)
pH URINE: 6 (ref 4.8–7.8)

## 2020-08-10 LAB — BASIC METABOLIC PANEL
Calcium: 8.6 mg/dL (ref 8.6–10.5)
Carbon Dioxide Total: 22 mmol/L — ABNORMAL LOW (ref 24–32)
Chloride: 98 mmol/L (ref 95–110)
Creatinine Serum: 0.53 mg/dL (ref 0.44–1.27)
E-GFR Creatinine (Female): >100 mL/min/{1.73_m2}
Glucose: 76 mg/dL (ref 70–99)
Potassium: 3.1 mmol/L — ABNORMAL LOW (ref 3.3–5.0)
Sodium: 133 mmol/L — ABNORMAL LOW (ref 135–145)
Urea Nitrogen, Blood (BUN): 5 mg/dL — ABNORMAL LOW (ref 8–22)

## 2020-08-10 LAB — BLD GAS VENOUS
Base Excess, Ven: 4 meq/L — ABNORMAL HIGH (ref ?–2)
HCO3, Ven: 29 meq/L — ABNORMAL HIGH (ref 20–28)
O2 Sat, Ven: 18 % — ABNORMAL LOW (ref 70–100)
PCO2, Ven: 46 mm[Hg] (ref 35–50)
PO2, Ven: 16 mm[Hg] — ABNORMAL LOW (ref 30–55)
pH, VEN: 7.41 — ABNORMAL HIGH (ref 7.30–7.40)

## 2020-08-10 LAB — LIPASE: Lipase: 22 U/L (ref 13–51)

## 2020-08-10 LAB — HEPATIC FUNCTION PANEL
Alanine Transferase (ALT): 19 U/L (ref 5–54)
Albumin: 3.1 g/dL — ABNORMAL LOW (ref 3.4–4.8)
Alkaline Phosphatase (ALP): 80 U/L (ref 35–115)
Aspartate Transaminase (AST): 27 U/L (ref 15–43)
Bilirubin Direct: 0.1 mg/dL (ref 0.0–0.2)
Bilirubin Total: 0.6 mg/dL (ref 0.3–1.3)
Protein: 6.7 g/dL (ref 6.3–8.3)

## 2020-08-10 LAB — LACTIC ACID, WHOLE BLD VENOUS: Lactic Acid, Whole Bld Venous: 3 mmol/L — ABNORMAL HIGH (ref 0.9–1.7)

## 2020-08-10 LAB — PHOSPHORUS (PO4): Phosphorus (PO4): 3.4 mg/dL (ref 2.4–5.0)

## 2020-08-10 LAB — MAGNESIUM (MG): Magnesium (Mg): 1.7 mg/dL (ref 1.5–2.6)

## 2020-08-10 LAB — POC SARS-COV-2 + FLU A/B
POC Influenza A: NOT DETECTED
POC Influenza B: NOT DETECTED
POC SARS-COV-2: NOT DETECTED

## 2020-08-10 LAB — LACTIC ACID, WHOLE BLD VENOUS REPEAT: Lactic Acid, Whole Bld Venous: 1.4 mmol/L (ref 0.9–1.7)

## 2020-08-10 MED ORDER — D5 / LACTATED RINGERS IV BOLUS
1000.0000 mL | Freq: Once | INTRAVENOUS | Status: AC
Start: 2020-08-10 — End: 2020-08-10
  Administered 2020-08-10: 1000 mL via INTRAVENOUS

## 2020-08-10 MED ORDER — POTASSIUM CHLORIDE ER 20 MEQ TABLET,EXTENDED RELEASE(PART/CRYST)
40.0000 meq | EXTENDED_RELEASE_TABLET | ORAL | Status: DC | PRN
Start: 2020-08-10 — End: 2020-08-10
  Administered 2020-08-10: 21:00:00 40 meq via ORAL
  Filled 2020-08-10: qty 2

## 2020-08-10 MED ORDER — ONDANSETRON 4 MG DISINTEGRATING TABLET
4.0000 mg | DISINTEGRATING_TABLET | Freq: Three times a day (TID) | ORAL | 0 refills | Status: AC | PRN
Start: 2020-08-10 — End: 2020-09-09

## 2020-08-10 MED ORDER — LACTATED RINGERS IV BOLUS - DURATION REQ
1000.0000 mL | Freq: Once | INTRAVENOUS | Status: AC
Start: 2020-08-10 — End: 2020-08-10
  Administered 2020-08-10: 19:00:00 1000 mL via INTRAVENOUS

## 2020-08-10 MED ORDER — ONDANSETRON 4 MG DISINTEGRATING TABLET
8.0000 mg | DISINTEGRATING_TABLET | Freq: Once | ORAL | Status: AC
Start: 2020-08-10 — End: 2020-08-10
  Administered 2020-08-10: 19:00:00 8 mg via ORAL
  Filled 2020-08-10: qty 2

## 2020-08-10 MED ORDER — ALBUTEROL SULFATE 90 MCG/ACTUATION BREATH ACTIVATED POWDER INHALER
1.0000 | INHALATION_SPRAY | Freq: Four times a day (QID) | RESPIRATORY_TRACT | 0 refills | Status: AC | PRN
Start: 2020-08-10 — End: 2020-09-09

## 2020-08-10 MED ORDER — LACTATED RINGERS IV BOLUS - DURATION REQ
1000.0000 mL | Freq: Once | INTRAVENOUS | Status: AC
Start: 2020-08-10 — End: 2020-08-10
  Administered 2020-08-10: 20:00:00 1000 mL via INTRAVENOUS

## 2020-08-10 NOTE — Progress Notes (Signed)
OB TRIAGE NOTE    Date of Service: 08/10/2020    Assessment and Plan:   [age]yr G3P0020 at 3015w2d presenting with dehydration 2/2 nausea and vomiting  -improved during assessment after administration of IV fluids, anti emetics, and electrolyte replacement.    Fetal well being: Reactive NST     Assessment/Plan: Dehydration 2/2 nausea/vomiting, likely viral URI versus gastroenteritis  CMP, CBC, type and screen ordered to r/o electrolyte abnormalities and leukocytosis. EKG ordered for persistent tachycardia at last two prenatal visits to confirm sinus rhythm, confirmed sinus tachycardia on EKG. Large ketones in urine also with leukocytosis, hypokalemia, mild hyponatremia, and elevated lactic acid so given IV fluid bolus x 3 as well as encouraging PO hydration and given Zofran ordered for nausea also with improvement, able to eat some broth and crackers also. Suspect leukocytosis related in part to hemoconcentration but also viral illness. Covid and flu test negative. Improvement in lactic acid with trending after hydration, patient wishing to go home with Zofran PO sent to pharmacy and also refill of albuterol for mild asthma, chronic condition. Ketones in urine cleared with IV fluids. Maternal HR also improved/decreased towards normal with IV fluids. Discussed that cannabis use may also cause rebound nausea/vomiting, education provided on this. Given strict return precautions if condition worsening. [facility].    Assessment/Plan: Safety assessment, passive suicidal ideation on screen, h/o anxiety, depression, panic disorder  On further discussion, patient reports recent passing in last few months of father which has been difficult, also relationship with FOB strained due to him not being as supportive, no concerns for abuse however feels she needs to return to [place] family is for further support during this pregnancy. Due to concerns on safety, screen, noted she has had thoughts of not  wanting to wake up in last month due to how difficult things have been with lack of support showing possible passive suicidal ideation on screen but no active plan or thoughts to hurt herself. Does have remote h/o suicide attempt where she took pills 5 years ago, was not hospitalized and did not become ill at that time, no other attempts or harmful behaviors. No current SI. She [facility] psychiatry and [facility] h/o anxiety, depression, panic disorder and medications and care managed well there, seen at least twice weekly. Has a positive therapeutic relationship. Feels she has support and can contact them if concerns, also mother, FOB grandmother, and other extended family supportive although in [place], and so she is planning to return potentially. She has strong desires to be present and is future and goal oriented due to current pregnancy and also experience of how difficult loss can be on others after passing of father. Feels safe to go home with lyft transport, and so due to lack of safety concerns discharged home with advice for close follow up. Given strict return precautions if concerned. [facility].    Subjective:  Patient presents with two days of nausea, vomiting, headache, and general malaise. Denies pain or localizing symptoms such as dysuria, URI symptoms, back pain. Malaise "feels like I'm withdrawing from my venlafaxine" even though she has been taking consistently. Denies pain currently. Able to tolerate some PO but has only had about 1 small bottle of water today due to lack of appetite. No sick contacts.     Also reports has been difficult time as FOB not as supportive during pregnancy, has been having to do  much on her own and her father passed in the last few months.    Denies s/sx of labor. No contractions, LOF, VB. Feeling good FM.     OB ROS:  Bleeding  no  Leaking Fluid  no  Fetal movement  active    Relevant Medical & Surgical  History and Pregnancy Complications:   #S<D  #anxiety and depression  #panic disorder  #maternal tachycardia   #cannabis use    Vitals:  BP 128/71   Pulse 110   Temp 36.6 C (97.9 F) (Oral)   Resp 18   Ht 1.651 m (5\' 5" )   Wt 69.1 kg (152 lb 5.4 oz)   LMP 02/08/2020 (Exact Date)   SpO2 99%   BMI 25.35 kg/m      Patient Vitals for the past 24 hrs:   BP Temp Temp src Pulse Resp SpO2 Height Weight   08/10/20 2142 128/71 36.6 C (97.9 F) Oral 110 18 99 % -- --   08/10/20 2000 126/77 36.7 C (98.1 F) Oral 109 18 99 % -- --   08/10/20 1734 -- 37 C (98.6 F) Oral -- 18 -- 1.651 m (5\' 5" ) 69.1 kg (152 lb 5.4 oz)   08/10/20 1729 121/75 -- -- 125 -- 96 % -- --       Fetal Monitoring:    Baseline 150 bpm  with Moderate variability.  Accelerations: Present  >15 beats  Decelerations: none  Reactive NST  Uterine contractions: none    Physical Exam:  General: alert, pleasant woman, appears uncomfortable  Cardiopulmonary: normal respiratory rate and effort, tachycardic  Abdomen: gravid, non-tender throughout, no CVA tenderness  Extremities: non-tender, no edema   Mental status: behavior appropriate, eye contact appropriate, mood appropriately concerned, anxious to euthymic, affect congruent, though process linear, though content future and goal oriented, health of herself and baby during pregnancy, seeking help and support, denies current SI/HI, insight/judgment good    Labs:  Results for orders placed or performed during the hospital encounter of 08/10/20   Urinalysis-Complete     Status: Abnormal   Result Value Status    Collection CLEAN CATCH Final    Color Yellow Final    Clarity Sl Turbid Final    Specific Gravity, Urine 1.026 Final    pH URINE 6.0 Final    Occult Blood Urine Negative Final    Bilirubin Urine Negative Final    Ketones 80  (Abnl) Final    Glucose Urine Negative Final    Protein Urine 100  (Abnl) Final    Urobilinogen 2.0 Final    Nitrite Urine Negative Final    Leuk Esterase Negative Final     Microscopic Indicated Final    WBC, Urine 5 Final    RBC 1 Final    Bacteria/HPF Moderate (Abnl) Final    Squamous EPI 13 (H) Final    Mucous/LPF Few Final    Hyaline Casts 1 Final   CBC with Differential     Status: Abnormal   Result Value Status    White Blood Cell Count 18.6 (H) Final    Red Blood Cell Count 4.12 Final    Hemoglobin 13.3 Final    Hematocrit 38.6 Final    MCV 93.7 Final    MCH 32.3 Final    MCHC 34.4 Final    RDW 12.7 Final    MPV 8.0 Final    Platelet Count 325 Final    Neutrophils % Auto 79.7 Final    Lymphocytes %  Auto 10.3 Final    Monocytes % Auto 8.5 Final    Eosinophils % Auto 0.5 Final    Basophils % Auto 1.0 Final    Neutrophils Abs Auto 14.8 (H) Final    Lymphocytes Abs Auto 1.9 Final    Monocytes Abs Auto 1.6 (H) Final    Eosinophils Abs Auto 0.1 Final    Basophils Abs Auto 0.2 Final   Basic Metabolic Panel     Status: Abnormal   Result Value Status    Sodium 133 (L) Final    Potassium 3.1 (L) Final    Chloride 98 Final    Carbon Dioxide Total 22 (L) Final    Urea Nitrogen, Blood (BUN) 5 (L) Final    Creatinine Serum 0.53 Final    Glucose 76 Final    Calcium 8.6 Final    E-GFR (Female) >100 Final   Magnesium (Mg)     Status: Normal   Result Value Status    Magnesium (Mg) 1.7 Final   Phosphorus (PO4)     Status: Normal   Result Value Status    Phosphorus (PO4) 3.4 Final   Hepatic Function Panel     Status: Abnormal   Result Value Status    Protein 6.7 Final    Alkaline Phosphatase (ALP) 80 Final    Aspartate Transaminase (AST) 27 Final    Bilirubin Total 0.6 Final    Alanine Transferase (ALT) 19 Final    Bilirubin Direct 0.1 Final    Albumin 3.1 (L) Final   Lipase     Status: Normal   Result Value Status    Lipase 22 Final   POC URINALYSIS-DIPSTICK     Status: Abnormal   Result Value Status    POC COLLECTION METHOD clean catch Final    POC CLARITY      POC COLOR Tea Colored Final    POC GLUCOSE URINE Negative Final    POC BILIRUBIN URINE Moderate (++) (Abnl) Final    POC KETONES Large  (100 mg/dL) (Abnl) Final    POC SP GRAVITY 1.015 Final    POC OCCULT BLOOD URINE Negative Final    POC PH URINALYSIS 7.5 Final    POC PROTEIN URINE 100 mg/dL (++) (Abnl) Final    POC UROBILINOGEN 1.0 Final    POC NITRITE URINE Negative Final    POC LEUK. ESTERASE Negative Final    POC UA READER ID [email or web address] Final   POC URINALYSIS-DIPSTICK     Status: Abnormal   Result Value Status    POC COLLECTION METHOD clean catch Final    POC CLARITY Clear Final    POC COLOR Yellow Final    POC GLUCOSE URINE 1/10 (100 mg/dL) (Abnl) Final    POC BILIRUBIN URINE Negative Final    POC KETONES Negative Final    POC SP GRAVITY 1.015 Final    POC OCCULT BLOOD URINE Negative Final    POC PH URINALYSIS 8.5 (Abnl) Final    POC PROTEIN URINE Negative Final    POC UROBILINOGEN 0.2 Final    POC NITRITE URINE Negative Final    POC LEUK. ESTERASE Negative Final    POC UA READER ID A Wiley RN Final   Lactic Acid, Whole Bld Venous     Status: Abnormal   Result Value Status    Lactic Acid, Whole Bld Venous 3.0 (H) Final   Bld Gas Venous     Status: Abnormal   Result Value Status    Patient Temp, Peter Minium  Final    PO2, Ven 16 (L) Final    O2 Sat, Ven 18 (L) Final    PCO2, Ven 46 Final    pH, VEN 7.41 (H) Final    HCO3, Ven 29 (H) Final    Base Excess, Ven 4 (H) Final     LACTATE downtrended to 1.3 after IV fluids    Disposition:  To home with return precautions   Follow up with [facility] as scheduled on 1/28 w/ Dr. Larose Hires and psychiatry on 1/19    This patient was seen and discussed with Dr. Alexis Goodell and Dr. Wynelle Link. Attending at time of discharge was Dr. Dareen Piano.     Report Electronically Signed by:    Marcelyn Ditty, MD   Family Medicine/Psychiatry, PGY-3, [facility]  PI#: [phone]    Contributions by:  Bishop Limbo, MD Resident     OB/GYN Chief Addendum  I saw and evaluated the patient. I agree with the assessment and plan, as documented and edited above, which we developed together.    Electronically Signed by:   Ivar Bury MD   OBGYN  PGY-4      Attending Addendum  This patient was seen, evaluated, and care plan was developed with the resident.  I agree with the assessment and plan as outlined in the resident's note.    Marthenia Rolling, MD  Assistant Clinical Professor  Obstetrics/Gynecology  PI# [phone]

## 2020-08-10 NOTE — Nurse Discharge Note (Signed)
08/10/2020  22:43                Instruction/follow-up given: Dr. Tanda Rockers    Disposition: Return precautions  PTL/Labor precautions -  Reinforced  Pre-Eclampsia Precautions - Reinforced    D/C instructions give by Dr. Tanda Rockers and this RN. Pt taken to [facility] via [facility] with RN and will be getting ride home from [facility].    Ammie Dalton, RN

## 2020-08-10 NOTE — Discharge Instructions (Signed)
Advice Nurse:   [phone] option 1   Call Advice Nurse 8:00 am to 5:00 pm M-F for decreased fetal movement, frank bleeding, leaking, regular contractions, trauma, or concerns.     Labor and Delivery:   [phone]   Call Labor and Delivery after 5:00pm M-F or on weekends/holidays for decreased fetal movement, frank bleeding, leaking, regular contractions, trauma, or concerns    Fetal movement:   - You should now feel your baby move many times each day and have probably noticed he or she is more active at certain times.   - If you are 28 weeks or greater, It is now time to begin doing "Kick Counts". Once per day count how many minutes it takes to feel your baby move 10 times.   - Pick your baby's most active time of day to count. This may be any time from morning to evening.   - It usually takes 5-20 minutes to feel 10 kicks, but up to 1 hour is normal. If you do not feel 10 movements in 1 hour, your baby may be sleeping. Drink a cold drink and snack and re-check in the next hour   - If after 2 hours you are still unable to feel 10 kicks, please call Labor & Delivery to speak with a nurse. If you do not feel your baby move at all, please come to Labor & Delivery for evaluation.   - You will be given a book to record this information in and we will discuss it at each prenatal visit.     Signs for Preterm Labor (<[redacted] weeks Gestation)   - Menstrual-like cramping that does not resolve or is associated with vaginal bleeding, increased vaginal discharge, or leakage of fluid.   - Contractions (cramp for 30-60 seconds) that happen more than 4 times per hour.   - A gush of fluid from the vagina.   - Any of the above could be signs of preterm labor. Please come to Labor & Delivery for evaluation if you experience any of these.     Signs of Labor (>[redacted] weeks Gestation)   - Menstrual-like cramping that is increasing in strength over time and/or is associated with vaginal bleeding, increased vaginal discharge, or leakage  of fluid.   - Contractions (cramp for 30-60 seconds) occurring more frequently eg.2-3 minutes apart)   - A gush of fluid from the vagina.   - Any of the above could be signs Labor. Please come to Labor & Delivery for evaluation if you experience any of these.     Preeclampsia:   - Preeclampsia is a disease of pregnancy where a woman's blood pressure increases along with stress on organs such as the kidneys, liver, brain, and placenta.   - Warning signs include a severe headache that is not relieved by Tylenol, pain in the right upper belly, swelling of your hands or face, visual disturbances or increase in blood pressure.   - Please come to L & D for evaluation if you experience any of these symptoms.     Activity & Exercise:   - You may find exercises much more difficult now. It is safe to continue low-impact exercises such as walking and swimming. Light weight-lifting is OK to continue, but avoid straining.   - If you can, keep walking for 20-30 minutes each day. Jogging is also ok to continue if you feel well. This will help you to stay fit for your birth and keep weight gain appropriate.       Pediatrician:   - Now is the time to find a Pediatrician if you have not already. [facility] offers 3 different clinic options for pediatric care:   1. [facility]: a resident clinic   2. [facility]): a private faculty-only clinic   3. [facility]: a resident & faculty clinic that offers care for all members of the family.   - You should have a paper from your first prenatal visit with phone numbers for each of these clinics; if not, ask your doctor or nurse for a new copy.     Education and Tour Information:   -  [facility] offer P[facility] Classes, call [phone] to schedule classes. We offer:   - Prepared Child Care - Breastfeeding - Baby Basics   - We offer a tour of Labor and Delivery every Sunday at 4:00 pm, please meet in the Main Hospital by  the Information Desk. There is no need to make an appointment.     Vaccines & Flu Shots:   - You should have a Flu Shot and Tdap (whooping cough) vaccine.   - It is important that everyone who is going to be around your new baby is up to date on vaccinations. Encourage your partner and family members to see their doctor for vaccines as well.

## 2020-08-11 LAB — ELECTROCARDIOGRAM WITH RHYTHM STRIP: QTC: 456

## 2020-08-12 LAB — CULTURE URINE, BACTI: URINE CULTURE: NO GROWTH

## 2020-08-12 NOTE — Progress Notes (Signed)
Mixed flora, no significant growth.

## 2020-08-20 ENCOUNTER — Other Ambulatory Visit: Payer: Self-pay | Admitting: PSYCHIATRY

## 2020-08-22 NOTE — Telephone Encounter (Signed)
Not a [place] patient.

## 2020-08-25 ENCOUNTER — Ambulatory Visit: Payer: 59 | Admitting: Physician Assistant

## 2020-08-29 ENCOUNTER — Telehealth: Payer: Self-pay

## 2020-08-29 NOTE — Telephone Encounter (Signed)
Pt called in and stated that she is  transferring ob care [place]. We have received her  medical records. The pt is 29 weeks I am sending a message asking where t place her on Dr. Oretha Milch schedule. The pt was talking with me and got sick on the phone she handed the phone to her mom and her mom said that her daugther cant keep anything down. I told the mom I am sending a message to the nurse. I asked the mother what pharmacy the daughter uses and she said [facility] [address]. The pts mom stated that she was seen at the ED and they gave her zofran to help with the nausea and vomiting, the pt said that it helped her a lot. I told the mom I am letting the nurse know. Please advise

## 2020-08-30 NOTE — Telephone Encounter (Signed)
Pt called back to the office. Pt was informed that since we have not seen her in the office that we are not able to refill her medication. Also informed pt that did she not have rx for the medication in her medication list. Pt stated that she was unable to hold water down. Pt was informed to please keep her appointment tomorrow to see [facility] and the pt hung on while I was talking to her.

## 2020-08-30 NOTE — Telephone Encounter (Signed)
Pt called no answer no dial tone. Was unable to lm due to no vm picked up.

## 2020-08-30 NOTE — Telephone Encounter (Signed)
Pt called no answer unable to lm due to no vm has been set up yet. Sent pt a Clinical cytogeneticist message with safe medication list and explained to pt since we have not seen her in the office that I can only suggest her taking over the counter medications.

## 2020-08-31 ENCOUNTER — Ambulatory Visit: Payer: Self-pay | Admitting: Obstetrics and Gynecology

## 2020-08-31 ENCOUNTER — Other Ambulatory Visit: Payer: Self-pay

## 2020-08-31 ENCOUNTER — Encounter: Payer: Self-pay | Admitting: Obstetrics and Gynecology

## 2020-08-31 VITALS — BP 120/87 | HR 107 | Ht 65.0 in | Wt 156.1 lb

## 2020-08-31 DIAGNOSIS — O99613 Diseases of the digestive system complicating pregnancy, third trimester: Secondary | ICD-10-CM

## 2020-08-31 DIAGNOSIS — O26849 Uterine size-date discrepancy, unspecified trimester: Secondary | ICD-10-CM

## 2020-08-31 DIAGNOSIS — Z3483 Encounter for supervision of other normal pregnancy, third trimester: Secondary | ICD-10-CM

## 2020-08-31 DIAGNOSIS — F1210 Cannabis abuse, uncomplicated: Secondary | ICD-10-CM

## 2020-08-31 DIAGNOSIS — J4520 Mild intermittent asthma, uncomplicated: Secondary | ICD-10-CM

## 2020-08-31 DIAGNOSIS — Z3A28 28 weeks gestation of pregnancy: Secondary | ICD-10-CM

## 2020-08-31 DIAGNOSIS — F410 Panic disorder [episodic paroxysmal anxiety] without agoraphobia: Secondary | ICD-10-CM

## 2020-08-31 DIAGNOSIS — F32A Depression, unspecified: Secondary | ICD-10-CM

## 2020-08-31 DIAGNOSIS — O219 Vomiting of pregnancy, unspecified: Secondary | ICD-10-CM

## 2020-08-31 DIAGNOSIS — Z8619 Personal history of other infectious and parasitic diseases: Secondary | ICD-10-CM

## 2020-08-31 DIAGNOSIS — K219 Gastro-esophageal reflux disease without esophagitis: Secondary | ICD-10-CM

## 2020-08-31 DIAGNOSIS — F419 Anxiety disorder, unspecified: Secondary | ICD-10-CM

## 2020-08-31 LAB — POCT URINALYSIS DIPSTICK OB
Bilirubin, UA: NEGATIVE
Blood, UA: NEGATIVE
Glucose, UA: NEGATIVE
Leukocytes, UA: NEGATIVE
Nitrite, UA: NEGATIVE
Spec Grav, UA: 1.025 (ref 1.010–1.025)
Urobilinogen, UA: 0.2 E.U./dL
pH, UA: 6 (ref 5.0–8.0)

## 2020-08-31 MED ORDER — METOCLOPRAMIDE HCL 10 MG PO TABS: 10.0000 mg | ORAL_TABLET | Freq: Four times a day (QID) | ORAL | 2 refills | Status: DC | PRN

## 2020-08-31 NOTE — Progress Notes (Signed)
TRANSFER IN OB HISTORY AND PHYSICAL  SUBJECTIVE:       Rachel Mercer is a 25 y.o. [id number] female, Patient's last menstrual period was 02/11/2020., Estimated Date of Delivery: 11/17/20, [redacted]w[redacted]d, who presents today for Transition of Prenatal Care. She has relocated from [place]. EPIC data migration from outside records is accomplished today.  Complaints today include: nausea with vomiting for the majority of her pregnancy. Tried Doxylamine and B12 but did not help. States that she feels very hungry often, and when she tries to eat, shortly after she will feel a rumbling in her stomach and then have vomiting, which leads to her being hungry again. Vomits on average between 3-4 times per day.   Reports fetus measuring small on last few exams, was going to have a follow up scan to assess growth. Normal anatomy scan, growth 38%ile (337 g), EGA 20.0. (perfomred at 20.2 weeks). Small fibroid noted on anatomy scan, 16 xxx 13 x 28 mm.    Vung has a history of anxiety and depression with panic disorder.  Previously on Xanax, Klonopin, and Effexor.  Stopped taking Xanax early in the pregnancy.  Still uses Klonopin intermittently for panic attacks (several times per week).  Was also seeing a counselor and psychiatrist.     Gynecologic History Patient's last menstrual period was 02/08/2020 (exact date). Normal  Age at menarche: 72 Contraception: none  H/o STI's: H/o chlamydia and HSV (no outbreaks in the past 2 or so years).  Last Pap: up to date per patient. No results noted in received records.   Obstetric History OB History  Gravida Para Term Preterm AB Living  3       2    SAB IAB Ectopic Multiple Live Births  2            # Outcome Date GA Lbr Len/2nd Weight Sex Delivery Anes PTL Lv  3 Current           2 SAB 2020          1 SAB 2020            Past Medical History:  Diagnosis Date  . Allergic rhinitis    seasonal  . Asthma   . Insomnia   . Panic disorder     Past Surgical  History:  Procedure Laterality Date  . APPENDECTOMY    . TONSILLECTOMY  2008    Current Outpatient Medications on File Prior to Visit  Medication Sig Dispense Refill  . albuterol (VENTOLIN HFA) 108 (90 Base) MCG/ACT inhaler TAKE 2 PUFFS BY MOUTH EVERY 6 HOURS AS NEEDED FOR WHEEZE OR SHORTNESS OF BREATH 18 g 0  . calcium carbonate (TUMS EX) 750 MG chewable tablet Chew 1 tablet by mouth daily.    . clonazePAM (KLONOPIN) 1 MG tablet take 1 tablet by mouth twice a day if needed  0  . Prenatal Vit-Fe Fumarate-FA (PRENATAL VITAMINS PO) Take by mouth.    . valACYclovir (VALTREX) 500 MG tablet TAKE 1 TABLET BY MOUTH ONCE DAILY (Patient taking differently: As needed) 30 tablet 0  . venlafaxine XR (EFFEXOR-XR) 75 MG 24 hr capsule TAKE 1 CAPSULE BY MOUTH  DAILY WITH BREAKFAST 90 capsule 3   No current facility-administered medications on file prior to visit.    No Known Allergies  Social History   Socioeconomic History  . Marital status: Single    Spouse name: Not on file  . Number of children: Not on file  . Years of education: Not  on file  . Highest education level: Not on file  Occupational History  . Not on file  Tobacco Use  . Smoking status: Never Smoker  . Smokeless tobacco: Never Used  Vaping Use  . Vaping Use: Never used  Substance and Sexual Activity  . Alcohol use: Not Currently    Alcohol/week: 0.0 - 3.0 standard drinks    Comment: occasional  . Drug use: Not Currently    Types: Marijuana    Comment: last used 10-05-15  . Sexual activity: Yes  Other Topics Concern  . Not on file  Social History Narrative  . Not on file   Social Determinants of Health   Financial Resource Strain: Not on file  Food Insecurity: Not on file  Transportation Needs: Not on file  Physical Activity: Not on file  Stress: Not on file  Social Connections: Not on file  Intimate Partner Violence: Not on file    Family History  Problem Relation Age of Onset  . Depression Mother   .  Mental illness Mother   . Irritable bowel syndrome Mother   . Colon polyps Mother   . Bipolar disorder Sister   . Mental illness Sister   . Seizures Sister   . Depression Sister   . Cancer Maternal Grandmother        colon  . Alzheimer's disease Maternal Grandmother   . Alzheimer's disease Maternal Grandfather   . Anxiety disorder Brother     The following portions of the patient's history were reviewed and updated as appropriate: allergies, current medications, past OB history, past medical history, past surgical history, past family history, past social history, and problem list.    OBJECTIVE: Initial Physical Exam (New OB)  GENERAL APPEARANCE: alert, well appearing, in no apparent distress ABDOMEN: soft, nontender, nondistended, no abnormal masses, no epigastric pain. FHT 162 bpm. FH 26 cm.  EXTREMITIES: no redness or tenderness in the calves or thighs SKIN: normal coloration and turgor, no rashes NEUROLOGIC: alert, oriented, normal speech, no focal findings or movement disorder noted  PELVIC EXAM Deferred  ASSESSMENT:  Encounter for supervision of other normal pregnancy in third trimester [redacted] weeks gestation of pregnancy - Plan: POC Urinalysis Dipstick OB Significant discrepancy between uterine size and clinical dates, antepartum Anxiety and depression Panic disorder Nausea and vomiting during pregnancy Marijuana abuse Reflux in pregnancy Asthma History of HSV  PLAN: 1. Patient will need to return in 1 week for ultrasound for growth due to size less than dates. Also will perform 28 week labs at that time. Reviewed prenatal labs, normal. 2. Will refer to [facility] for further medication management and establishment with counseling for anxiety and depression with panic attacks. Discussed Klonopin use in pregnancy, would recommend alternative option, patient is maxed on dose of Effexor. Consider Wellbutrin, Lexapro, Buspar, or Ativan.  Given information for [employer].   3. Nausea and vomiting, uncontrolled with Vitamin B6 and Diclegis. Will prescribe Reglan. Today's urine notes ketones.  4. Marijuana in pregnancy, continue to encourage cessation. Notes using it to help control nausea and vomiting.  5. Reflux in pregnancy, was taking Prilosec and Zantac, however at this time only requires use of Tums.  6. H/o asthma, has albuterol inhaler as needed.  7. Will need HSV prophylaxis with Valtrex beginning at 36 weeks.   RTC in 2 weeks for OB visit with Dr. Logan Bores.     Hildred Laser, MD [facility]

## 2020-08-31 NOTE — Patient Instructions (Signed)
https://www.nichd.nih.gov/health/topics/labor-delivery/Pages/default.aspx">  Third Trimester of Pregnancy  The third trimester of pregnancy is from week 28 through week 40. This is months 7 through 9. The third trimester is a time when the unborn baby (fetus) is growing rapidly. At the end of the ninth month, the fetus is about 20 inches long and weighs 6-10 pounds. Body changes during your third trimester During the third trimester, your body will continue to go through many changes. The changes vary and generally return to normal after your baby is born. Physical changes  Your weight will continue to increase. You can expect to gain 25-35 pounds (11-16 kg) by the end of the pregnancy if you begin pregnancy at a normal weight. If you are underweight, you can expect to gain 28-40 lb (about 13-18 kg), and if you are overweight, you can expect to gain 15-25 lb (about 7-11 kg).  You may begin to get stretch marks on your hips, abdomen, and breasts.  Your breasts will continue to grow and may hurt. A yellow fluid (colostrum) may leak from your breasts. This is the first milk you are producing for your baby.  You may have changes in your hair. These can include thickening of your hair, rapid growth, and changes in texture. Some people also have hair loss during or after pregnancy, or hair that feels dry or thin.  Your belly button may stick out.  You may notice more swelling in your hands, face, or ankles. Health changes  You may have heartburn.  You may have constipation.  You may develop hemorrhoids.  You may develop swollen, bulging veins (varicose veins) in your legs.  You may have increased body aches in the pelvis, back, or thighs. This is due to weight gain and increased hormones that are relaxing your joints.  You may have increased tingling or numbness in your hands, arms, and legs. The skin on your abdomen may also feel numb.  You may feel short of breath because of your  expanding uterus. Other changes  You may urinate more often because the fetus is moving lower into your pelvis and pressing on your bladder.  You may have more problems sleeping. This may be caused by the size of your abdomen, an increased need to urinate, and an increase in your body's metabolism.  You may notice the fetus "dropping," or moving lower in your abdomen (lightening).  You may have increased vaginal discharge.  You may notice that you have pain around your pelvic bone as your uterus distends. Follow these instructions at home: Medicines  Follow your health care provider's instructions regarding medicine use. Specific medicines may be either safe or unsafe to take during pregnancy. Do not take any medicines unless approved by your health care provider.  Take a prenatal vitamin that contains at least 600 micrograms (mcg) of folic acid. Eating and drinking  Eat a healthy diet that includes fresh fruits and vegetables, whole grains, good sources of protein such as meat, eggs, or tofu, and low-fat dairy products.  Avoid raw meat and unpasteurized juice, milk, and cheese. These carry germs that can harm you and your baby.  Eat 4 or 5 small meals rather than 3 large meals a day.  You may need to take these actions to prevent or treat constipation: ? Drink enough fluid to keep your urine pale yellow. ? Eat foods that are high in fiber, such as beans, whole grains, and fresh fruits and vegetables. ? Limit foods that are high in fat and   processed sugars, such as fried or sweet foods. Activity  Exercise only as directed by your health care provider. Most people can continue their usual exercise routine during pregnancy. Try to exercise for 30 minutes at least 5 days a week. Stop exercising if you experience contractions in the uterus.  Stop exercising if you develop pain or cramping in the lower abdomen or lower back.  Avoid heavy lifting.  Do not exercise if it is very hot or  humid or if you are at a high altitude.  If you choose to, you may continue to have sex unless your health care provider tells you not to. Relieving pain and discomfort  Take frequent breaks and rest with your legs raised (elevated) if you have leg cramps or low back pain.  Take warm sitz baths to soothe any pain or discomfort caused by hemorrhoids. Use hemorrhoid cream if your health care provider approves.  Wear a supportive bra to prevent discomfort from breast tenderness.  If you develop varicose veins: ? Wear support hose as told by your health care provider. ? Elevate your feet for 15 minutes, 3-4 times a day. ? Limit salt in your diet. Safety  Talk to your health care provider before traveling far distances.  Do not use hot tubs, steam rooms, or saunas.  Wear your seat belt at all times when driving or riding in a car.  Talk with your health care provider if someone is verbally or physically abusive to you. Preparing for birth To prepare for the arrival of your baby:  Take prenatal classes to understand, practice, and ask questions about labor and delivery.  Visit the hospital and tour the maternity area.  Purchase a rear-facing car seat and make sure you know how to install it in your car.  Prepare the baby's room or sleeping area. Make sure to remove all pillows and stuffed animals from the baby's crib to prevent suffocation. General instructions  Avoid cat litter boxes and soil used by cats. These carry germs that can cause birth defects in the baby. If you have a cat, ask someone to clean the litter box for you.  Do not douche or use tampons. Do not use scented sanitary pads.  Do not use any products that contain nicotine or tobacco, such as cigarettes, e-cigarettes, and chewing tobacco. If you need help quitting, ask your health care provider.  Do not use any herbal remedies, illegal drugs, or medicines that were not prescribed to you. Chemicals in these products  can harm your baby.  Do not drink alcohol.  You will have more frequent prenatal exams during the third trimester. During a routine prenatal visit, your health care provider will do a physical exam, perform tests, and discuss your overall health. Keep all follow-up visits. This is important. Where to find more information  American Pregnancy Association: americanpregnancy.org  American College of Obstetricians and Gynecologists: acog.org/en/Womens%20Health/Pregnancy  Office on Women's Health: womenshealth.gov/pregnancy Contact a health care provider if you have:  A fever.  Mild pelvic cramps, pelvic pressure, or nagging pain in your abdominal area or lower back.  Vomiting or diarrhea.  Bad-smelling vaginal discharge or foul-smelling urine.  Pain when you urinate.  A headache that does not go away when you take medicine.  Visual changes or see spots in front of your eyes. Get help right away if:  Your water breaks.  You have regular contractions less than 5 minutes apart.  You have spotting or bleeding from your vagina.  You   have severe abdominal pain.  You have difficulty breathing.  You have chest pain.  You have fainting spells.  You have not felt your baby move for the time period told by your health care provider.  You have new or increased pain, swelling, or redness in an arm or leg. Summary  The third trimester of pregnancy is from week 28 through week 40 (months 7 through 9).  You may have more problems sleeping. This can be caused by the size of your abdomen, an increased need to urinate, and an increase in your body's metabolism.  You will have more frequent prenatal exams during the third trimester. Keep all follow-up visits. This is important. This information is not intended to replace advice given to you by your health care provider. Make sure you discuss any questions you have with your health care provider. Document Revised: 12/23/2019 Document  Reviewed: 10/29/2019 Elsevier Patient Education  2021 [employer].   .ewc GESTATIONAL DIABETES TESTING FOR PREGNANCY  Pregnant women can develop a condition known as Gestational Diabetes (diabetes brought on by pregnancy) which can pose a risk to both mother and baby. A glucose tolerance test is a common type of testing for potential gestational diabetes.  There are several tests intended to identify gestational diabetes in pregnant women. The first, called the Glucose Challenge Screening, is a preliminary screening test performed between 26-28 weeks. If a woman tests positive during this screening test, the second test, called the Glucose Tolerance Test, may be performed. This test will diagnose whether diabetes exists or not by indicating whether or not the body is using glucose (a type of sugar) effectively.  The Glucose Challenge Screening is now considered to be a standard test performed during the early part of the third trimester of pregnancy.  What is the Glucose Challenge Screening Test? No preparation is required prior to the test. During the test, the mother is asked to drink a sweet liquid (glucose) and then will have blood drawn one hour from having the drink, as blood glucose levels normally peak within one hour. No fasting is required prior to this test.  The test evaluates how your body processes sugar. A high level in your blood may indicate your body is not processing sugar effectively (positive test). If the results of this screen are positive, the woman may have the Glucose Tolerance Test performed. It is important to note that not all women who test positive for the Glucose Challenge Screening test are found to have diabetes upon further diagnosis.  What is the Glucose Tolerance Test? Prior to the taking the glucose tolerance test, your doctor will ask you to make sure and eat at least 150mg  of carbohydrates (about what you will get from a slice or two of bread) for three  days prior to the time you will be asked to fast. You will not be permitted to eat or drink anything but sips of water for 14 hours prior to the test, so it is best to schedule the test for first thing in the morning.  Additionally, you should plan to have someone drive you to and from the test since your energy levels may be low and there is a slight possibility you may feel light-headed.  When you arrive, the technician will draw blood to measure your baseline "fasting blood glucose level". You will be asked to drink a larger volume (or more concentrated solution) of the glucose drink than was used in the initial Glucose Challenge Screening test.  Your blood will be drawn and tested every hour for the next three hours.  The following are the values that the American Diabetes Association considers to be abnormal during the Glucose Tolerance Test:  Interval Abnormal reading Fasting 95 mg/dl or higher One hour 573 mg/dl or higher Two hours 220 mg/dl or higher Three hours 254 mg/dl or higher  What if my Glucose Tolerance Test Results are Abnormal? If only one of your readings comes back abnormal, your doctor may suggest some changes to your diet and/or test you again later in the pregnancy. If two or more of your readings come back abnormal, you'll be diagnosed with Gestational Diabetes and your doctor or midwife will talk to you about a treatment plan. Treating diabetes during pregnancy is extremely important to protect the health of both mother and baby.   Compiled using information from the following sources:  1. American Diabetes Association  https://www.diabetes.org  2. Emedicine  https://www.emedicine.com  3. [employer] of Diabetes and Digestive and Kidney Diseases

## 2020-08-31 NOTE — Progress Notes (Signed)
OB pt present for OB transfer. Pt c/o pelvic pain, nausea/vomiting x 3-4 a day. Pt also stated having some acid reflux and taking tums.

## 2020-09-08 ENCOUNTER — Encounter: Payer: Self-pay | Admitting: Physician Assistant

## 2020-09-08 ENCOUNTER — Other Ambulatory Visit: Payer: Self-pay

## 2020-09-08 ENCOUNTER — Ambulatory Visit (INDEPENDENT_AMBULATORY_CARE_PROVIDER_SITE_OTHER): Payer: 59 | Admitting: Physician Assistant

## 2020-09-08 VITALS — BP 119/73 | HR 125 | Temp 98.3°F | Wt 160.4 lb

## 2020-09-08 DIAGNOSIS — F32A Depression, unspecified: Secondary | ICD-10-CM

## 2020-09-08 DIAGNOSIS — F419 Anxiety disorder, unspecified: Secondary | ICD-10-CM

## 2020-09-08 MED ORDER — CLONAZEPAM 0.5 MG PO TABS: 0.5000 mg | ORAL_TABLET | Freq: Every day | ORAL | 0 refills | Status: DC | PRN

## 2020-09-08 MED ORDER — VENLAFAXINE HCL ER 225 MG PO TB24: 1.0000 | ORAL_TABLET | Freq: Every day | ORAL | 0 refills | Status: DC

## 2020-09-08 MED ORDER — BUSPIRONE HCL 7.5 MG PO TABS: 7.5000 mg | ORAL_TABLET | Freq: Two times a day (BID) | ORAL | 0 refills | Status: DC

## 2020-09-08 MED ORDER — VENLAFAXINE HCL ER 150 MG PO CP24: ORAL_CAPSULE | ORAL | 0 refills | Status: DC

## 2020-09-08 MED ORDER — VENLAFAXINE HCL ER 75 MG PO CP24: ORAL_CAPSULE | ORAL | 0 refills | Status: DC

## 2020-09-08 MED ORDER — BUSPIRONE HCL 10 MG PO TABS
10.0000 mg | ORAL_TABLET | Freq: Two times a day (BID) | ORAL | 0 refills | Status: DC
Start: 2020-09-08 — End: 2020-09-29

## 2020-09-08 NOTE — Progress Notes (Signed)
Established patient visit   Patient: Rachel Mercer   DOB: 27-Oct-1995   25 y.o. Female  MRN: [id number] Visit Date: 09/08/2020  Today's healthcare provider: Trey Sailors, PA-C   Chief Complaint  Patient presents with  . Anxiety  I,Gisel Vipond M Aidan Moten,acting as a scribe for Trey Sailors, PA-C.,have documented all relevant documentation on the behalf of Trey Sailors, PA-C,as directed by  Trey Sailors, PA-C while in the presence of Trey Sailors, PA-C.  Subjective    HPI  Anxiety Patients today for anxiety. She reports moving back to [place] recently and needing to get reestablish psychiatrist. She is currently taking klonopin 1 MG several times a week and venlafaxine 225 MG. She was prescribed this by a psychiatrist in [place]. She has seen her gynecologist in the past week who recommended tapering off klonopin and alternatives including Buspar, wellbutrin. She does not want to be on wellbutrin because a provider previously told her it would "mess up whatever she was doing." She has an evaluation at [facility] tomorrow.        Medications: Outpatient Medications Prior to Visit  Medication Sig  . albuterol (VENTOLIN HFA) 108 (90 Base) MCG/ACT inhaler TAKE 2 PUFFS BY MOUTH EVERY 6 HOURS AS NEEDED FOR WHEEZE OR SHORTNESS OF BREATH  . aspirin 81 MG chewable tablet Chew 81 mg by mouth daily.  . calcium carbonate (TUMS EX) 750 MG chewable tablet Chew 1 tablet by mouth daily.  . metoCLOPramide (REGLAN) 10 MG tablet Take 1 tablet (10 mg total) by mouth 4 (four) times daily as needed for nausea or vomiting.  . Prenatal Vit-Fe Fumarate-FA (PRENATAL VITAMINS PO) Take by mouth.  . valACYclovir (VALTREX) 500 MG tablet TAKE 1 TABLET BY MOUTH ONCE DAILY (Patient taking differently: As needed)  . [DISCONTINUED] clonazePAM (KLONOPIN) 1 MG tablet take 1 tablet by mouth twice a day if needed  . [DISCONTINUED] Venlafaxine HCl 225 MG TB24 Take 1 tablet by mouth daily.   No  facility-administered medications prior to visit.    Review of Systems     Objective    BP 119/73 (BP Location: Right Arm, Patient Position: Sitting, Cuff Size: Large)   Pulse (!) 125   Temp 98.3 F (36.8 C) (Oral)   Wt 160 lb 6.4 oz (72.8 kg)   LMP 02/08/2020 (Exact Date)   SpO2 97%   BMI 26.69 kg/m     Physical Exam Constitutional:      Appearance: Normal appearance.  Cardiovascular:     Rate and Rhythm: Normal rate and regular rhythm.     Heart sounds: Normal heart sounds.  Pulmonary:     Effort: Pulmonary effort is normal.     Breath sounds: Normal breath sounds.  Abdominal:     General: Abdomen is protuberant.     Palpations: Abdomen is soft.  Skin:    General: Skin is warm and dry.  Neurological:     Mental Status: She is alert and oriented to person, place, and time. Mental status is at baseline.  Psychiatric:        Mood and Affect: Mood normal.        Behavior: Behavior normal.       No results found for any visits on 09/08/20.  Assessment & Plan    1. Depression, unspecified depression type  Counseled it is not to my comfort level to prescribe klonopin to pregnant patients. I will give her ten tablets at a lower dose  so she does not experience withdrawal to get her to her psychiatry appointment at [facility]. From here on her medications should be managed by a psychiatrist. Will send in medications as below due to cost as she is currently uninsured. Will add buspar for better control of anxiety.   - busPIRone (BUSPAR) 10 MG tablet; Take 1 tablet (10 mg total) by mouth 2 (two) times daily.  Dispense: 60 tablet; Refill: 0 - venlafaxine XR (EFFEXOR XR) 75 MG 24 hr capsule; Take one pill daily along with 150 mg tablet to total 225 mg daily.  Dispense: 30 capsule; Refill: 0 - venlafaxine XR (EFFEXOR XR) 150 MG 24 hr capsule; Take one pill daily along with 75 mg tablet to total 225 mg daily.  Dispense: 30 capsule; Refill: 0 - clonazePAM (KLONOPIN) 0.5 MG tablet; Take  1 tablet (0.5 mg total) by mouth daily as needed for anxiety.  Dispense: 10 tablet; Refill: 0  2. Anxiety  - busPIRone (BUSPAR) 10 MG tablet; Take 1 tablet (10 mg total) by mouth 2 (two) times daily.  Dispense: 60 tablet; Refill: 0 - venlafaxine XR (EFFEXOR XR) 75 MG 24 hr capsule; Take one pill daily along with 150 mg tablet to total 225 mg daily.  Dispense: 30 capsule; Refill: 0 - venlafaxine XR (EFFEXOR XR) 150 MG 24 hr capsule; Take one pill daily along with 75 mg tablet to total 225 mg daily.  Dispense: 30 capsule; Refill: 0 - clonazePAM (KLONOPIN) 0.5 MG tablet; Take 1 tablet (0.5 mg total) by mouth daily as needed for anxiety.  Dispense: 10 tablet; Refill: 0   No follow-ups on file.      ITrey Sailors, PA-C, have reviewed all documentation for this visit. The documentation on 09/09/20 for the exam, diagnosis, procedures, and orders are all accurate and complete.  The entirety of the information documented in the History of Present Illness, Review of Systems and Physical Exam were personally obtained by me. Portions of this information were initially documented by [facility] and reviewed by me for thoroughness and accuracy.     Maryella Shivers  [facility] [phone] (phone) [phone] (fax)  [facility]

## 2020-09-10 ENCOUNTER — Other Ambulatory Visit: Payer: Self-pay | Admitting: PSYCHIATRY

## 2020-09-12 DIAGNOSIS — F331 Major depressive disorder, recurrent, moderate: Secondary | ICD-10-CM | POA: Diagnosis not present

## 2020-09-13 ENCOUNTER — Other Ambulatory Visit: Payer: Self-pay | Admitting: Physician Assistant

## 2020-09-13 ENCOUNTER — Other Ambulatory Visit: Payer: Self-pay | Admitting: Obstetrics and Gynecology

## 2020-09-13 ENCOUNTER — Other Ambulatory Visit: Payer: Self-pay

## 2020-09-13 ENCOUNTER — Ambulatory Visit (INDEPENDENT_AMBULATORY_CARE_PROVIDER_SITE_OTHER): Payer: Self-pay

## 2020-09-13 DIAGNOSIS — F32A Depression, unspecified: Secondary | ICD-10-CM

## 2020-09-13 DIAGNOSIS — Z3492 Encounter for supervision of normal pregnancy, unspecified, second trimester: Secondary | ICD-10-CM

## 2020-09-13 DIAGNOSIS — Z3483 Encounter for supervision of other normal pregnancy, third trimester: Secondary | ICD-10-CM

## 2020-09-13 DIAGNOSIS — F419 Anxiety disorder, unspecified: Secondary | ICD-10-CM

## 2020-09-13 NOTE — Telephone Encounter (Signed)
Just to clarify, a therapist would not have any prescriptive authority. That would be for counseling. If she is to meet with a psychiatrist on that date, then they would have the ability to prescribe her medicines. If she was taking klonopin several times per week as she stated, this upcoming week she should take the lower dose three times per week. The following week she should take it twice a week. The week after that she should be taking it once per week. I will refill the prescription to get her to March 17 but right not it is too early.

## 2020-09-14 LAB — CBC
Hematocrit: 37.2 % (ref 34.0–46.6)
Hemoglobin: 12.5 g/dL (ref 11.1–15.9)
MCH: 31.5 pg (ref 26.6–33.0)
MCHC: 33.6 g/dL (ref 31.5–35.7)
MCV: 94 fL (ref 79–97)
Platelets: 313 10*3/uL (ref 150–450)
RBC: 3.97 x10E6/uL (ref 3.77–5.28)
RDW: 12 % (ref 11.7–15.4)
WBC: 16.6 10*3/uL — ABNORMAL HIGH (ref 3.4–10.8)

## 2020-09-14 LAB — GLUCOSE, 1 HOUR GESTATIONAL: Gestational Diabetes Screen: 80 mg/dL (ref 65–139)

## 2020-09-14 LAB — RPR: RPR Ser Ql: NONREACTIVE

## 2020-09-16 ENCOUNTER — Encounter: Payer: Self-pay | Admitting: Obstetrics and Gynecology

## 2020-09-16 ENCOUNTER — Ambulatory Visit (INDEPENDENT_AMBULATORY_CARE_PROVIDER_SITE_OTHER): Payer: Self-pay | Admitting: Obstetrics and Gynecology

## 2020-09-16 ENCOUNTER — Other Ambulatory Visit: Payer: Self-pay

## 2020-09-16 VITALS — BP 114/82 | HR 116 | Wt 165.6 lb

## 2020-09-16 DIAGNOSIS — Z3A31 31 weeks gestation of pregnancy: Secondary | ICD-10-CM

## 2020-09-16 DIAGNOSIS — Z3483 Encounter for supervision of other normal pregnancy, third trimester: Secondary | ICD-10-CM

## 2020-09-16 DIAGNOSIS — F32A Depression, unspecified: Secondary | ICD-10-CM

## 2020-09-16 DIAGNOSIS — F419 Anxiety disorder, unspecified: Secondary | ICD-10-CM

## 2020-09-16 LAB — POCT URINALYSIS DIPSTICK OB
Bilirubin, UA: NEGATIVE
Blood, UA: NEGATIVE
Glucose, UA: NEGATIVE
Ketones, UA: NEGATIVE
Leukocytes, UA: NEGATIVE
Nitrite, UA: NEGATIVE
POC,PROTEIN,UA: NEGATIVE
Spec Grav, UA: 1.01 (ref 1.010–1.025)
Urobilinogen, UA: 0.2 E.U./dL
pH, UA: 7 (ref 5.0–8.0)

## 2020-09-16 MED ORDER — CLONAZEPAM 1 MG PO TABS: 1.0000 mg | ORAL_TABLET | Freq: Two times a day (BID) | ORAL | 0 refills | Status: DC | PRN

## 2020-09-16 NOTE — Progress Notes (Signed)
ROB: Patient says she is doing well with the pregnancy.  Daily fetal movement.  She does state that her anxiety and depression seem to be still an issue.  She is taking Effexor and Klonopin.  She has an appointment in approximately 1 month with psychiatry for follow-up and care.  She is currently using Reglan for nausea and vomiting and says this is working well for her.

## 2020-09-29 ENCOUNTER — Other Ambulatory Visit: Payer: Self-pay

## 2020-09-29 ENCOUNTER — Ambulatory Visit (INDEPENDENT_AMBULATORY_CARE_PROVIDER_SITE_OTHER): Payer: Self-pay | Admitting: Obstetrics and Gynecology

## 2020-09-29 ENCOUNTER — Encounter: Payer: Self-pay | Admitting: Obstetrics and Gynecology

## 2020-09-29 VITALS — BP 120/81 | HR 107 | Wt 176.3 lb

## 2020-09-29 DIAGNOSIS — F419 Anxiety disorder, unspecified: Secondary | ICD-10-CM

## 2020-09-29 DIAGNOSIS — O99613 Diseases of the digestive system complicating pregnancy, third trimester: Secondary | ICD-10-CM

## 2020-09-29 DIAGNOSIS — Z23 Encounter for immunization: Secondary | ICD-10-CM

## 2020-09-29 DIAGNOSIS — Z3A33 33 weeks gestation of pregnancy: Secondary | ICD-10-CM

## 2020-09-29 DIAGNOSIS — K219 Gastro-esophageal reflux disease without esophagitis: Secondary | ICD-10-CM

## 2020-09-29 DIAGNOSIS — Z3483 Encounter for supervision of other normal pregnancy, third trimester: Secondary | ICD-10-CM

## 2020-09-29 DIAGNOSIS — F32A Depression, unspecified: Secondary | ICD-10-CM

## 2020-09-29 LAB — POCT URINALYSIS DIPSTICK OB
Bilirubin, UA: NEGATIVE
Blood, UA: NEGATIVE
Glucose, UA: NEGATIVE
Ketones, UA: NEGATIVE
Leukocytes, UA: NEGATIVE
Nitrite, UA: NEGATIVE
POC,PROTEIN,UA: NEGATIVE
Spec Grav, UA: <=1.005 — AB (ref 1.010–1.025)
Urobilinogen, UA: 0.2 E.U./dL
pH, UA: 8.5 — AB (ref 5.0–8.0)

## 2020-09-29 MED ORDER — PANTOPRAZOLE SODIUM 20 MG PO TBEC: 20.0000 mg | DELAYED_RELEASE_TABLET | Freq: Two times a day (BID) | ORAL | 2 refills | Status: DC

## 2020-09-29 MED ORDER — TETANUS-DIPHTH-ACELL PERTUSSIS 5-2.5-18.5 LF-MCG/0.5 IM SUSY
0.5000 mL | PREFILLED_SYRINGE | Freq: Once | INTRAMUSCULAR | Status: AC
  Administered 2020-09-29: 0.5 mL via INTRAMUSCULAR

## 2020-09-29 NOTE — Progress Notes (Addendum)
ROB:  Orlena complains of worsening reflux symptoms, not relieved by Tums. Will prescribe Protonix. Also noting some swelling in hands and feet, but usually goes away. Patient has appointment soon with [facility] for her anxiety and depression.  Discussed breastfeeding more. Desires unsure method for contraception. For Tdap today, signed blood consent. For next growth scan at 36 weeks, last scan normal. Will order. Normal 28 week labs. RTC in 2 weeks.     The following were addressed during this visit:  Breastfeeding Education - Early initiation of breastfeeding    Comments: Keeps milk supply adequate, helps contract uterus and slow bleeding, and early milk is the perfect first food and is easy to digest.   - The importance of exclusive breastfeeding    Comments: Provides antibodies, Lower risk of breast and ovarian cancers, and type-2 diabetes,Helps your body recover, Reduced chance of SIDS.   - Risks of giving your baby anything other than breast milk if you are breastfeeding    Comments: Make the baby less content with breastfeeds, may make my baby more susceptible to illness, and may reduce my milk supply.   - Nonpharmacological pain relief methods for labor    Comments: Deep breathing, focusing on pleasant things, movement and walking, heating pads or cold compress, massage and relaxation, continuous support from someone you trust, and Doulas   - The importance of early skin-to-skin contact    Comments: Keeps baby warm and secure, helps keep baby's blood sugar up and breathing steady, easier to bond and breastfeed, and helps calm baby.  - Rooming-in on a 24-hour basis    Comments: Easier to learn baby's feeding cues, easier to bond and get to know each other, and encourages milk production.   - Feeding on demand or baby-led feeding    Comments: Helps prevent breastfeeding complications, helps bring in good milk supply, prevents under or overfeeding, and helps baby feel content and  satisfied   - Frequent feeding to help assure optimal milk production    Comments: Making a full supply of milk requires frequent removal of milk from breasts, infant will eat 8-12 times in 24 hours, if separated from infant use breast massage, hand expression and/ or pumping to remove milk from breasts.   - Effective positioning and attachment    Comments: Helps my baby to get enough breast milk, helps to produce an adequate milk supply, and helps prevent nipple pain and damage   - Exclusive breastfeeding for the first 6 months    Comments: Builds a healthy milk supply and keeps it up, protects baby from sickness and disease, and breastmilk has everything your baby needs for the first 6 months.  - Individualized Education    Comments: Contraindications to breastfeeding and other special medical conditions

## 2020-09-29 NOTE — Progress Notes (Signed)
OB-Pt present for routine prenatal care. Pt stated having acid reflux causing vomiting and throat burning and pelvic pain and pelvic making popping noises.

## 2020-09-29 NOTE — Patient Instructions (Signed)
Tdap (Tetanus, Diphtheria, Pertussis) Vaccine: What You Need to Know 1. Why get vaccinated? Tdap vaccine can prevent tetanus, diphtheria, and pertussis. Diphtheria and pertussis spread from person to person. Tetanus enters the body through cuts or wounds.  TETANUS (T) causes painful stiffening of the muscles. Tetanus can lead to serious health problems, including being unable to open the mouth, having trouble swallowing and breathing, or death.  DIPHTHERIA (D) can lead to difficulty breathing, heart failure, paralysis, or death.  PERTUSSIS (aP), also known as "whooping cough," can cause uncontrollable, violent coughing that makes it hard to breathe, eat, or drink. Pertussis can be extremely serious especially in babies and young children, causing pneumonia, convulsions, brain damage, or death. In teens and adults, it can cause weight loss, loss of bladder control, passing out, and rib fractures from severe coughing. 2. Tdap vaccine Tdap is only for children 7 years and older, adolescents, and adults.  Adolescents should receive a single dose of Tdap, preferably at age 71 or 49 years. Pregnant people should get a dose of Tdap during every pregnancy, preferably during the early part of the third trimester, to help protect the newborn from pertussis. Infants are most at risk for severe, life-threatening complications from pertussis. Adults who have never received Tdap should get a dose of Tdap. Also, adults should receive a booster dose of either Tdap or Td (a different vaccine that protects against tetanus and diphtheria but not pertussis) every 10 years, or after 5 years in the case of a severe or dirty wound or burn. Tdap may be given at the same time as other vaccines. 3. Talk with your health care provider Tell your vaccine provider if the person getting the vaccine:  Has had an allergic reaction after a previous dose of any vaccine that protects against tetanus, diphtheria, or pertussis, or  has any severe, life-threatening allergies  Has had a coma, decreased level of consciousness, or prolonged seizures within 7 days after a previous dose of any pertussis vaccine (DTP, DTaP, or Tdap)  Has seizures or another nervous system problem  Has ever had Guillain-Barr Syndrome (also called "GBS")  Has had severe pain or swelling after a previous dose of any vaccine that protects against tetanus or diphtheria In some cases, your health care provider may decide to postpone Tdap vaccination until a future visit. People with minor illnesses, such as a cold, may be vaccinated. People who are moderately or severely ill should usually wait until they recover before getting Tdap vaccine.  Your health care provider can give you more information. 4. Risks of a vaccine reaction  Pain, redness, or swelling where the shot was given, mild fever, headache, feeling tired, and nausea, vomiting, diarrhea, or stomachache sometimes happen after Tdap vaccination. People sometimes faint after medical procedures, including vaccination. Tell your provider if you feel dizzy or have vision changes or ringing in the ears.  As with any medicine, there is a very remote chance of a vaccine causing a severe allergic reaction, other serious injury, or death. 5. What if there is a serious problem? An allergic reaction could occur after the vaccinated person leaves the clinic. If you see signs of a severe allergic reaction (hives, swelling of the face and throat, difficulty breathing, a fast heartbeat, dizziness, or weakness), call 9-1-1 and get the person to the nearest hospital. For other signs that concern you, call your health care provider.  Adverse reactions should be reported to the Vaccine Adverse Event Reporting System (VAERS). Your health  care provider will usually file this report, or you can do it yourself. Visit the VAERS website at www.vaers.hhs.gov or call 1-800-822-7967. VAERS is only for reporting  reactions, and VAERS staff members do not give medical advice. 6. The National Vaccine Injury Compensation Program The National Vaccine Injury Compensation Program (VICP) is a federal program that was created to compensate people who may have been injured by certain vaccines. Claims regarding alleged injury or death due to vaccination have a time limit for filing, which may be as short as two years. Visit the VICP website at www.hrsa.gov/vaccinecompensation or call 1-800-338-2382 to learn about the program and about filing a claim. 7. How can I learn more?  Ask your health care provider.  Call your local or state health department.  Visit the website of the Food and Drug Administration (FDA) for vaccine package inserts and additional information at www.fda.gov/vaccines-blood-biologics/vaccines.  Contact the Centers for Disease Control and Prevention (CDC): ? Call 1-800-232-4636 (1-800-CDC-INFO) or ? Visit CDC's website at www.cdc.gov/vaccines. Vaccine Information Statement Tdap (Tetanus, Diphtheria, Pertussis) Vaccine (03/04/2020) This information is not intended to replace advice given to you by your health care provider. Make sure you discuss any questions you have with your health care provider. Document Revised: 03/30/2020 Document Reviewed: 03/30/2020 Elsevier Patient Education  2021 [employer]   Third Trimester of Pregnancy  The third trimester of pregnancy is from week 28 through week 40. This is also called months 7 through 9. This trimester is when your unborn baby (fetus) is growing very fast. At the end of the ninth month, the unborn baby is about 20 inches long. It weighs about 6-10 pounds. Body changes during your third trimester Your body continues to go through many changes during this time. The changes vary and generally return to normal after the baby is born. Physical changes  Your weight will continue to increase. You may gain 25-35 pounds (11-16 kg) by the end of  the pregnancy. If you are underweight, you may gain 28-40 lb (about 13-18 kg). If you are overweight, you may gain 15-25 lb (about 7-11 kg).  You may start to get stretch marks on your hips, belly (abdomen), and breasts.  Your breasts will continue to grow and may hurt. A yellow fluid (colostrum) may leak from your breasts. This is the first milk you are making for your baby.  You may have changes in your hair.  Your belly button may stick out.  You may have more swelling in your hands, face, or ankles. Health changes  You may have heartburn.  You may have trouble pooping (constipation).  You may get hemorrhoids. These are swollen veins in the butt that can itch or get painful.  You may have swollen veins (varicose veins) in your legs.  You may have more body aches in the pelvis, back, or thighs.  You may have more tingling or numbness in your hands, arms, and legs. The skin on your belly may also feel numb.  You may feel short of breath as your womb (uterus) gets bigger. Other changes  You may pee (urinate) more often.  You may have more problems sleeping.  You may notice the unborn baby "dropping," or moving lower in your belly.  You may have more discharge coming from your vagina.  Your joints may feel loose, and you may have pain around your pelvic bone. Follow these instructions at home: Medicines  Take over-the-counter and prescription medicines only as told by your doctor. Some medicines are   during pregnancy.  Take a prenatal vitamin that contains at least 600 micrograms (mcg) of folic acid. Eating and drinking  Eat healthy meals that include: ? Fresh fruits and vegetables. ? Whole grains. ? Good sources of protein, such as meat, eggs, or tofu. ? Low-fat dairy products.  Avoid raw meat and unpasteurized juice, milk, and cheese. These carry germs that can harm you and your baby.  Eat 4 or 5 small meals rather than 3 large meals a day.  You may need to  take these actions to prevent or treat trouble pooping: ? Drink enough fluids to keep your pee (urine) pale yellow. ? Eat foods that are high in fiber. These include beans, whole grains, and fresh fruits and vegetables. ? Limit foods that are high in fat and sugar. These include fried or sweet foods. Activity  Exercise only as told by your doctor. Stop exercising if you start to have cramps in your womb.  Avoid heavy lifting.  Do not exercise if it is too hot or too humid, or if you are in a place of great height (high altitude).  If you choose to, you may have sex unless your doctor tells you not to. Relieving pain and discomfort  Take breaks often, and rest with your legs raised (elevated) if you have leg cramps or low back pain.  Take warm water baths (sitz baths) to soothe pain or discomfort caused by hemorrhoids. Use hemorrhoid cream if your doctor approves.  Wear a good support bra if your breasts are tender.  If you develop bulging, swollen veins in your legs: ? Wear support hose as told by your doctor. ? Raise your feet for 15 minutes, 3-4 times a day. ? Limit salt in your food. Safety  Talk to your doctor before traveling far distances.  Do not use hot tubs, steam rooms, or saunas.  Wear your seat belt at all times when you are in a car.  Talk with your doctor if someone is hurting you or yelling at you a lot. Preparing for your baby's arrival To prepare for the arrival of your baby:  Take prenatal classes.  Visit the hospital and tour the maternity area.  Buy a rear-facing car seat. Learn how to install it in your car.  Prepare the baby's room. Take out all pillows and stuffed animals from the baby's crib. General instructions  Avoid cat litter boxes and soil used by cats. These carry germs that can cause harm to the baby and can cause a loss of your baby by miscarriage or stillbirth.  Do not douche or use tampons. Do not use scented sanitary pads.  Do not  smoke or use any products that contain nicotine or tobacco. If you need help quitting, ask your doctor.  Do not drink alcohol.  Do not use herbal medicines, illegal drugs, or medicines that were not approved by your doctor. Chemicals in these products can affect your baby.  Keep all follow-up visits. This is important. Where to find more information  American Pregnancy Association: americanpregnancy.org  [employer]: www.acog.org  Office on Women's Health: KeywordPortfolios.com.br Contact a doctor if:  You have a fever.  You have mild cramps or pressure in your lower belly.  You have a nagging pain in your belly area.  You vomit, or you have watery poop (diarrhea).  You have bad-smelling fluid coming from your vagina.  You have pain when you pee, or your pee smells bad.  You have  a headache that does not go away when you take medicine.  You have changes in how you see, or you see spots in front of your eyes. Get help right away if:  Your water breaks.  You have regular contractions that are less than 5 minutes apart.  You are spotting or bleeding from your vagina.  You have very bad belly cramps or pain.  You have trouble breathing.  You have chest pain.  You faint.  You have not felt the baby move for the amount of time told by your doctor.  You have new or increased pain, swelling, or redness in an arm or leg. Summary  The third trimester is from week 28 through week 40 (months 7 through 9). This is the time when your unborn baby is growing very fast.  During this time, your discomfort may increase as you gain weight and as your baby grows.  Get ready for your baby to arrive by taking prenatal classes, buying a rear-facing car seat, and preparing the baby's room.  Get help right away if you are bleeding from your vagina, you have chest pain and trouble breathing, or you have not felt the baby move for the amount of  time told by your doctor. This information is not intended to replace advice given to you by your health care provider. Make sure you discuss any questions you have with your health care provider. Document Revised: 12/23/2019 Document Reviewed: 10/29/2019 Elsevier Patient Education  [place]. Common Medications Safe in Pregnancy  Acne:      Constipation:  Benzoyl Peroxide     Colace  Clindamycin      Dulcolax Suppository  Topica Erythromycin     Fibercon  Salicylic Acid      Metamucil         Miralax AVOID:        Senakot   Accutane    Cough:  Retin-A       Cough Drops  Tetracycline      Phenergan w/ Codeine if Rx  Minocycline      Robitussin (Plain & DM)  Antibiotics:     Crabs/Lice:  Ceclor       RID  Cephalosporins    AVOID:  E-Mycins      Kwell  Keflex  Macrobid/Macrodantin   Diarrhea:  Penicillin      Kao-Pectate  Zithromax      Imodium AD         PUSH FLUIDS AVOID:       Cipro     Fever:  Tetracycline      Tylenol (Regular or Extra  Minocycline       Strength)  Levaquin      Extra Strength-Do not          Exceed 8 tabs/24 hrs Caffeine:        <272m/day (equiv. To 1 cup of coffee or  approx. 3 12 oz sodas)         Gas: Cold/Hayfever:       Gas-X  Benadryl      Mylicon  Claritin       Phazyme  **Claritin-D        Chlor-Trimeton    Headaches:  Dimetapp      ASA-Free Excedrin  Drixoral-Non-Drowsy     Cold Compress  Mucinex (Guaifenasin)     Tylenol (Regular or Extra  Sudafed/Sudafed-12 Hour     Strength)  **Sudafed PE Pseudoephedrine   Tylenol Cold & Sinus  Vicks Vapor Rub  Zyrtec  **AVOID if Problems With Blood Pressure         Heartburn: Avoid lying down for at least 1 hour after meals  Aciphex      Maalox     Rash:  Milk of Magnesia     Benadryl    Mylanta       1% Hydrocortisone Cream  Pepcid  Pepcid Complete   Sleep Aids:  Prevacid      Ambien   Prilosec       Benadryl  Rolaids       Chamomile Tea  Tums (Limit  4/day)     Unisom         Tylenol PM         Warm milk-add vanilla or  Hemorrhoids:       Sugar for taste  Anusol/Anusol H.C.  (RX: Analapram 2.5%)  Sugar Substitutes:  Hydrocortisone OTC     Ok in moderation  Preparation H      Tucks        Vaseline lotion applied to tissue with wiping    Herpes:     Throat:  Acyclovir      Oragel  Famvir  Valtrex     Vaccines:         Flu Shot Leg Cramps:       *Gardasil  Benadryl      Hepatitis A         Hepatitis B Nasal Spray:       Pneumovax  Saline Nasal Spray     Polio Booster         Tetanus Nausea:       Tuberculosis test or PPD  Vitamin B6 25 mg TID   AVOID:    Dramamine      *Gardasil  Emetrol       Live Poliovirus  Ginger Root 250 mg QID    MMR (measles, mumps &  High Complex Carbs @ Bedtime    rebella)  Sea Bands-Accupressure    Varicella (Chickenpox)  Unisom 1/2 tab TID     *No known complications           If received before Pain:         Known pregnancy;   Darvocet       Resume series after  Lortab        Delivery  Percocet    Yeast:   Tramadol      Femstat  Tylenol 3      Gyne-lotrimin  Ultram       Monistat  Vicodin           MISC:         All Sunscreens           Hair Coloring/highlights          Insect Repellant's          (Including DEET)         Mystic Tans Breastfeeding  Choosing to breastfeed is one of the best decisions you can make for yourself and your baby. A change in hormones during pregnancy causes your breasts to make breast milk in your milk-producing glands. Hormones prevent breast milk from being released before your baby is born. They also prompt milk flow after birth. Once breastfeeding has begun, thoughts of your baby, as well as his or her sucking or crying, can stimulate the release of milk from your milk-producing glands. Benefits of breastfeeding Research shows that breastfeeding offers  many health benefits for infants and mothers. It also offers a cost-free and convenient way to feed your  baby. For your baby  Your first milk (colostrum) helps your baby's digestive system to function better.  Special cells in your milk (antibodies) help your baby to fight off infections.  Breastfed babies are less likely to develop asthma, allergies, obesity, or type 2 diabetes. They are also at lower risk for sudden infant death syndrome (SIDS).  Nutrients in breast milk are better able to meet your baby's needs compared to infant formula.  Breast milk improves your baby's brain development. For you  Breastfeeding helps to create a very special bond between you and your baby.  Breastfeeding is convenient. Breast milk costs nothing and is always available at the correct temperature.  Breastfeeding helps to burn calories. It helps you to lose the weight that you gained during pregnancy.  Breastfeeding makes your uterus return faster to its size before pregnancy. It also slows bleeding (lochia) after you give birth.  Breastfeeding helps to lower your risk of developing type 2 diabetes, osteoporosis, rheumatoid arthritis, cardiovascular disease, and breast, ovarian, uterine, and endometrial cancer later in life. Breastfeeding basics Starting breastfeeding  Find a comfortable place to sit or lie down, with your neck and back well-supported.  Place a pillow or a rolled-up blanket under your baby to bring him or her to the level of your breast (if you are seated). Nursing pillows are specially designed to help support your arms and your baby while you breastfeed.  Make sure that your baby's tummy (abdomen) is facing your abdomen.  Gently massage your breast. With your fingertips, massage from the outer edges of your breast inward toward the nipple. This encourages milk flow. If your milk flows slowly, you may need to continue this action during the feeding.  Support your breast with 4 fingers underneath and your thumb above your nipple (make the letter "C" with your hand). Make sure your  fingers are well away from your nipple and your baby's mouth.  Stroke your baby's lips gently with your finger or nipple.  When your baby's mouth is open wide enough, quickly bring your baby to your breast, placing your entire nipple and as much of the areola as possible into your baby's mouth. The areola is the colored area around your nipple. ? More areola should be visible above your baby's upper lip than below the lower lip. ? Your baby's lips should be opened and extended outward (flanged) to ensure an adequate, comfortable latch. ? Your baby's tongue should be between his or her lower gum and your breast.  Make sure that your baby's mouth is correctly positioned around your nipple (latched). Your baby's lips should create a seal on your breast and be turned out (everted).  It is common for your baby to suck about 2-3 minutes in order to start the flow of breast milk. Latching Teaching your baby how to latch onto your breast properly is very important. An improper latch can cause nipple pain, decreased milk supply, and poor weight gain in your baby. Also, if your baby is not latched onto your nipple properly, he or she may swallow some air during feeding. This can make your baby fussy. Burping your baby when you switch breasts during the feeding can help to get rid of the air. However, teaching your baby to latch on properly is still the best way to prevent fussiness from swallowing air while breastfeeding. Signs that your baby has  successfully latched onto your nipple  Silent tugging or silent sucking, without causing you pain. Infant's lips should be extended outward (flanged).  Swallowing heard between every 3-4 sucks once your milk has started to flow (after your let-down milk reflex occurs).  Muscle movement above and in front of his or her ears while sucking. Signs that your baby has not successfully latched onto your nipple  Sucking sounds or smacking sounds from your baby while  breastfeeding.  Nipple pain. If you think your baby has not latched on correctly, slip your finger into the corner of your baby's mouth to break the suction and place it between your baby's gums. Attempt to start breastfeeding again. Signs of successful breastfeeding Signs from your baby  Your baby will gradually decrease the number of sucks or will completely stop sucking.  Your baby will fall asleep.  Your baby's body will relax.  Your baby will retain a small amount of milk in his or her mouth.  Your baby will let go of your breast by himself or herself. Signs from you  Breasts that have increased in firmness, weight, and size 1-3 hours after feeding.  Breasts that are softer immediately after breastfeeding.  Increased milk volume, as well as a change in milk consistency and color by the fifth day of breastfeeding.  Nipples that are not sore, cracked, or bleeding. Signs that your baby is getting enough milk  Wetting at least 1-2 diapers during the first 24 hours after birth.  Wetting at least 5-6 diapers every 24 hours for the first week after birth. The urine should be clear or pale yellow by the age of 5 days.  Wetting 6-8 diapers every 24 hours as your baby continues to grow and develop.  At least 3 stools in a 24-hour period by the age of 5 days. The stool should be soft and yellow.  At least 3 stools in a 24-hour period by the age of 7 days. The stool should be seedy and yellow.  No loss of weight greater than 10% of birth weight during the first 3 days of life.  Average weight gain of 4-7 oz (113-198 g) per week after the age of 4 days.  Consistent daily weight gain by the age of 5 days, without weight loss after the age of 2 weeks. After a feeding, your baby may spit up a small amount of milk. This is normal. Breastfeeding frequency and duration Frequent feeding will help you make more milk and can prevent sore nipples and extremely full breasts (breast  engorgement). Breastfeed when you feel the need to reduce the fullness of your breasts or when your baby shows signs of hunger. This is called "breastfeeding on demand." Signs that your baby is hungry include:  Increased alertness, activity, or restlessness.  Movement of the head from side to side.  Opening of the mouth when the corner of the mouth or cheek is stroked (rooting).  Increased sucking sounds, smacking lips, cooing, sighing, or squeaking.  Hand-to-mouth movements and sucking on fingers or hands.  Fussing or crying. Avoid introducing a pacifier to your baby in the first 4-6 weeks after your baby is born. After this time, you may choose to use a pacifier. Research has shown that pacifier use during the first year of a baby's life decreases the risk of sudden infant death syndrome (SIDS). Allow your baby to feed on each breast as long as he or she wants. When your baby unlatches or falls asleep while  feeding from the first breast, offer the second breast. Because newborns are often sleepy in the first few weeks of life, you may need to awaken your baby to get him or her to feed. Breastfeeding times will vary from baby to baby. However, the following rules can serve as a guide to help you make sure that your baby is properly fed:  Newborns (babies 74 weeks of age or younger) may breastfeed every 1-3 hours.  Newborns should not go without breastfeeding for longer than 3 hours during the day or 5 hours during the night.  You should breastfeed your baby a minimum of 8 times in a 24-hour period. Breast milk pumping Pumping and storing breast milk allows you to make sure that your baby is exclusively fed your breast milk, even at times when you are unable to breastfeed. This is especially important if you go back to work while you are still breastfeeding, or if you are not able to be present during feedings. Your lactation consultant can help you find a method of pumping that works best for  you and give you guidelines about how long it is safe to store breast milk.      Caring for your breasts while you breastfeed Nipples can become dry, cracked, and sore while breastfeeding. The following recommendations can help keep your breasts moisturized and healthy:  Avoid using soap on your nipples.  Wear a supportive bra designed especially for nursing. Avoid wearing underwire-style bras or extremely tight bras (sports bras).  Air-dry your nipples for 3-4 minutes after each feeding.  Use only cotton bra pads to absorb leaked breast milk. Leaking of breast milk between feedings is normal.  Use lanolin on your nipples after breastfeeding. Lanolin helps to maintain your skin's normal moisture barrier. Pure lanolin is not harmful (not toxic) to your baby. You may also hand express a few drops of breast milk and gently massage that milk into your nipples and allow the milk to air-dry. In the first few weeks after giving birth, some women experience breast engorgement. Engorgement can make your breasts feel heavy, warm, and tender to the touch. Engorgement peaks within 3-5 days after you give birth. The following recommendations can help to ease engorgement:  Completely empty your breasts while breastfeeding or pumping. You may want to start by applying warm, moist heat (in the shower or with warm, water-soaked hand towels) just before feeding or pumping. This increases circulation and helps the milk flow. If your baby does not completely empty your breasts while breastfeeding, pump any extra milk after he or she is finished.  Apply ice packs to your breasts immediately after breastfeeding or pumping, unless this is too uncomfortable for you. To do this: ? Put ice in a plastic bag. ? Place a towel between your skin and the bag. ? Leave the ice on for 20 minutes, 2-3 times a day.  Make sure that your baby is latched on and positioned properly while breastfeeding. If engorgement persists after  48 hours of following these recommendations, contact your health care provider or a Science writer. Overall health care recommendations while breastfeeding  Eat 3 healthy meals and 3 snacks every day. Well-nourished mothers who are breastfeeding need an additional 450-500 calories a day. You can meet this requirement by increasing the amount of a balanced diet that you eat.  Drink enough water to keep your urine pale yellow or clear.  Rest often, relax, and continue to take your prenatal vitamins to prevent fatigue,  stress, and low vitamin and mineral levels in your body (nutrient deficiencies).  Do not use any products that contain nicotine or tobacco, such as cigarettes and e-cigarettes. Your baby may be harmed by chemicals from cigarettes that pass into breast milk and exposure to secondhand smoke. If you need help quitting, ask your health care provider.  Avoid alcohol.  Do not use illegal drugs or marijuana.  Talk with your health care provider before taking any medicines. These include over-the-counter and prescription medicines as well as vitamins and herbal supplements. Some medicines that may be harmful to your baby can pass through breast milk.  It is possible to become pregnant while breastfeeding. If birth control is desired, ask your health care provider about options that will be safe while breastfeeding your baby. Where to find more information: [employer]: www.llli.org Contact a health care provider if:  You feel like you want to stop breastfeeding or have become frustrated with breastfeeding.  Your nipples are cracked or bleeding.  Your breasts are red, tender, or warm.  You have: ? Painful breasts or nipples. ? A swollen area on either breast. ? A fever or chills. ? Nausea or vomiting. ? Drainage other than breast milk from your nipples.  Your breasts do not become full before feedings by the fifth day after you give birth.  You feel sad  and depressed.  Your baby is: ? Too sleepy to eat well. ? Having trouble sleeping. ? More than 67 week old and wetting fewer than 6 diapers in a 24-hour period. ? Not gaining weight by 34 days of age.  Your baby has fewer than 3 stools in a 24-hour period.  Your baby's skin or the white parts of his or her eyes become yellow. Get help right away if:  Your baby is overly tired (lethargic) and does not want to wake up and feed.  Your baby develops an unexplained fever. Summary  Breastfeeding offers many health benefits for infant and mothers.  Try to breastfeed your infant when he or she shows early signs of hunger.  Gently tickle or stroke your baby's lips with your finger or nipple to allow the baby to open his or her mouth. Bring the baby to your breast. Make sure that much of the areola is in your baby's mouth. Offer one side and burp the baby before you offer the other side.  Talk with your health care provider or lactation consultant if you have questions or you face problems as you breastfeed. This information is not intended to replace advice given to you by your health care provider. Make sure you discuss any questions you have with your health care provider. Document Revised: 10/10/2017 Document Reviewed: 08/17/2016 Elsevier Patient Education  2021 [employer].

## 2020-10-13 ENCOUNTER — Encounter: Payer: Self-pay | Admitting: Obstetrics and Gynecology

## 2020-10-13 ENCOUNTER — Other Ambulatory Visit: Payer: Self-pay

## 2020-10-13 ENCOUNTER — Ambulatory Visit (INDEPENDENT_AMBULATORY_CARE_PROVIDER_SITE_OTHER): Payer: Medicaid Other | Admitting: Obstetrics and Gynecology

## 2020-10-13 ENCOUNTER — Other Ambulatory Visit: Payer: Self-pay | Admitting: Family Medicine

## 2020-10-13 VITALS — BP 133/87 | HR 106 | Wt 177.7 lb

## 2020-10-13 DIAGNOSIS — F411 Generalized anxiety disorder: Secondary | ICD-10-CM | POA: Diagnosis not present

## 2020-10-13 DIAGNOSIS — F419 Anxiety disorder, unspecified: Secondary | ICD-10-CM

## 2020-10-13 DIAGNOSIS — Z3483 Encounter for supervision of other normal pregnancy, third trimester: Secondary | ICD-10-CM

## 2020-10-13 DIAGNOSIS — Z3A35 35 weeks gestation of pregnancy: Secondary | ICD-10-CM

## 2020-10-13 DIAGNOSIS — F32A Depression, unspecified: Secondary | ICD-10-CM

## 2020-10-13 DIAGNOSIS — F331 Major depressive disorder, recurrent, moderate: Secondary | ICD-10-CM | POA: Diagnosis not present

## 2020-10-13 LAB — POCT URINALYSIS DIPSTICK OB
Bilirubin, UA: NEGATIVE
Blood, UA: NEGATIVE
Glucose, UA: NEGATIVE
Ketones, UA: NEGATIVE
Leukocytes, UA: NEGATIVE
Nitrite, UA: NEGATIVE
Spec Grav, UA: 1.015 (ref 1.010–1.025)
Urobilinogen, UA: 0.2 E.U./dL
pH, UA: 8 (ref 5.0–8.0)

## 2020-10-13 NOTE — Telephone Encounter (Signed)
Requested medication (s) are due for refill today: Yes  Requested medication (s) are on the active medication list: Yes  Last refill:  09/08/20  Future visit scheduled: No  Notes to clinic:  Unable to refill per protocol due to failed labs, no updated results. Patient is out of meds, see notes below.     Requested Prescriptions  Pending Prescriptions Disp Refills   venlafaxine XR (EFFEXOR XR) 150 MG 24 hr capsule 30 capsule 0    Sig: Take one pill daily along with 75 mg tablet to total 225 mg daily.      Psychiatry: Antidepressants - SNRI - desvenlafaxine & venlafaxine Failed - 10/13/2020  3:17 PM      Failed - LDL in normal range and within 360 days    No results found for: LDLCALC, LDLC, HIRISKLDL, POCLDL, LDLDIRECT, REALLDLC, TOTLDLC        Failed - Total Cholesterol in normal range and within 360 days    No results found for: CHOL, POCCHOL, CHOLTOT        Failed - Triglycerides in normal range and within 360 days    No results found for: TRIG, POCTRIG        Passed - Completed PHQ-2 or PHQ-9 in the last 360 days      Passed - Last BP in normal range    BP Readings from Last 1 Encounters:  10/13/20 133/87          Passed - Valid encounter within last 6 months    Recent Outpatient Visits           1 month ago Depression, unspecified depression type   [facility] [place], [place], [place]   2 years ago Possible pregnancy   [facility], [place], [place]   3 years ago Acute midline low back pain without sciatica   [facility] [place], [place], [place]   3 years ago Depression, unspecified depression type   [facility] [place], Jodell Cipro, PA-C   4 years ago Right lower quadrant abdominal pain   [facility] [place], [place]                  venlafaxine XR (EFFEXOR XR) 75 MG 24 hr capsule 30 capsule 0    Sig: Take one pill daily along with 150 mg tablet to total 225 mg daily.       Psychiatry: Antidepressants - SNRI - desvenlafaxine & venlafaxine Failed - 10/13/2020  3:17 PM      Failed - LDL in normal range and within 360 days    No results found for: LDLCALC, LDLC, HIRISKLDL, POCLDL, LDLDIRECT, REALLDLC, TOTLDLC        Failed - Total Cholesterol in normal range and within 360 days    No results found for: CHOL, POCCHOL, CHOLTOT        Failed - Triglycerides in normal range and within 360 days    No results found for: TRIG, POCTRIG        Passed - Completed PHQ-2 or PHQ-9 in the last 360 days      Passed - Last BP in normal range    BP Readings from Last 1 Encounters:  10/13/20 133/87          Passed - Valid encounter within last 6 months    Recent Outpatient Visits           1 month ago Depression, unspecified depression type   [facility]  Trey Sailors, PA-C   2 years ago Possible pregnancy   [facility], [place], [place]   3 years ago Acute midline low back pain without sciatica   [facility] [place], [place], [place]   3 years ago Depression, unspecified depression type   [facility] [place], Jodell Cipro, PA-C   4 years ago Right lower quadrant abdominal pain   [facility], [place], [place]

## 2020-10-13 NOTE — Telephone Encounter (Signed)
Medication Refill - Medication: Venlafaxine XR  Has the patient contacted their pharmacy? Yes.   Pt calling stating that she contacted pharmacy and that they told her to follow up with PCP. She states that she is completely out of medication. Please advise.  (Agent: If no, request that the patient contact the pharmacy for the refill.) (Agent: If yes, when and what did the pharmacy advise?)  Preferred Pharmacy (with phone number or street name):  [facility] [address] - [place], [place] - [address] [facility]  [address] [place] [address]  Phone: [phone] Fax: [phone]  Hours: Not open 24 hours     Agent: Please be advised that RX refills may take up to 3 business days. We ask that you follow-up with your pharmacy.

## 2020-10-13 NOTE — Progress Notes (Signed)
ROB: Started having morning sickness again with some vomiting each morning.  Able to keep food and liquids down the rest of the day.  She will try taking Tums at night before bed and restart her Reglan.  GBS, GC/CT cultures next visit.  Begin HSV prophylaxis next visit.

## 2020-10-14 MED ORDER — VENLAFAXINE HCL ER 75 MG PO CP24: ORAL_CAPSULE | ORAL | 1 refills | Status: DC

## 2020-10-14 MED ORDER — VENLAFAXINE HCL ER 150 MG PO CP24: ORAL_CAPSULE | ORAL | 0 refills | Status: DC

## 2020-10-14 NOTE — Telephone Encounter (Addendum)
[facility] refill request for the following medications:  1. venlafaxine XR (EFFEXOR XR) 75 MG 24 hr capsule  2. venlafaxine XR (EFFEXOR XR) 150 MG 24 hr capsule  Last Rx: 09/08/20 for 30 days no refill LOV: 09/08/20 w/Adriana Please advise. Thanks TNP

## 2020-10-19 ENCOUNTER — Other Ambulatory Visit: Payer: Self-pay

## 2020-10-19 ENCOUNTER — Ambulatory Visit
Admission: RE | Admit: 2020-10-19 | Discharge: 2020-10-19 | Disposition: A | Discharge status: Home / Self Care | Payer: Medicaid Other | Source: Ambulatory Visit | Attending: Obstetrics and Gynecology | Admitting: Obstetrics and Gynecology

## 2020-10-19 DIAGNOSIS — F419 Anxiety disorder, unspecified: Secondary | ICD-10-CM | POA: Diagnosis not present

## 2020-10-19 DIAGNOSIS — Z3A36 36 weeks gestation of pregnancy: Secondary | ICD-10-CM | POA: Diagnosis not present

## 2020-10-19 DIAGNOSIS — Z362 Encounter for other antenatal screening follow-up: Secondary | ICD-10-CM | POA: Diagnosis not present

## 2020-10-19 DIAGNOSIS — F32A Depression, unspecified: Secondary | ICD-10-CM | POA: Diagnosis not present

## 2020-10-21 ENCOUNTER — Ambulatory Visit (INDEPENDENT_AMBULATORY_CARE_PROVIDER_SITE_OTHER): Payer: Medicaid Other | Admitting: Obstetrics and Gynecology

## 2020-10-21 ENCOUNTER — Encounter: Payer: Self-pay | Admitting: Obstetrics and Gynecology

## 2020-10-21 ENCOUNTER — Other Ambulatory Visit: Payer: Self-pay

## 2020-10-21 VITALS — BP 129/86 | HR 109 | Ht 65.0 in | Wt 184.6 lb

## 2020-10-21 DIAGNOSIS — Z3685 Encounter for antenatal screening for Streptococcus B: Secondary | ICD-10-CM

## 2020-10-21 DIAGNOSIS — F419 Anxiety disorder, unspecified: Secondary | ICD-10-CM

## 2020-10-21 DIAGNOSIS — Z3A36 36 weeks gestation of pregnancy: Secondary | ICD-10-CM

## 2020-10-21 DIAGNOSIS — F32A Depression, unspecified: Secondary | ICD-10-CM

## 2020-10-21 DIAGNOSIS — B009 Herpesviral infection, unspecified: Secondary | ICD-10-CM

## 2020-10-21 DIAGNOSIS — Z3483 Encounter for supervision of other normal pregnancy, third trimester: Secondary | ICD-10-CM

## 2020-10-21 DIAGNOSIS — Z9189 Other specified personal risk factors, not elsewhere classified: Secondary | ICD-10-CM

## 2020-10-21 LAB — POCT URINALYSIS DIPSTICK OB
Bilirubin, UA: NEGATIVE
Blood, UA: NEGATIVE
Glucose, UA: NEGATIVE
Ketones, UA: NEGATIVE
Leukocytes, UA: NEGATIVE
Nitrite, UA: NEGATIVE
Spec Grav, UA: 1.015 (ref 1.010–1.025)
Urobilinogen, UA: 0.2 E.U./dL
pH, UA: 7 (ref 5.0–8.0)

## 2020-10-21 MED ORDER — VALACYCLOVIR HCL 500 MG PO TABS: 500.0000 mg | ORAL_TABLET | Freq: Two times a day (BID) | ORAL | 0 refills | Status: DC

## 2020-10-21 NOTE — Progress Notes (Signed)
ROB: Patient denies major complaints.  36 week cultures performed today.  Reviewed growth ultrasound, borderline growth restriction noted, 11%ile. Normal AFI. Will need repeat in 2-3 weeks, to refer to MFM for scan. Size remains less than dates.  Will also begin weekly NSTs. Discussed that if growth remains restricted, would likely necessitate earlier delivery.  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  Labor precautions reviewed. Reiterated fetal kick counts.  RTC in 1 week.

## 2020-10-21 NOTE — Progress Notes (Signed)
OB-Pt present for routine prenatal care. Pt stated that she was doing well.  

## 2020-10-23 LAB — STREP GP B NAA: Strep Gp B NAA: NEGATIVE

## 2020-10-23 LAB — GC/CHLAMYDIA PROBE AMP
Chlamydia trachomatis, NAA: NEGATIVE
Neisseria Gonorrhoeae by PCR: NEGATIVE

## 2020-10-26 ENCOUNTER — Other Ambulatory Visit: Payer: Self-pay | Admitting: Obstetrics and Gynecology

## 2020-10-26 DIAGNOSIS — Z9189 Other specified personal risk factors, not elsewhere classified: Secondary | ICD-10-CM

## 2020-10-28 ENCOUNTER — Ambulatory Visit (INDEPENDENT_AMBULATORY_CARE_PROVIDER_SITE_OTHER): Payer: Medicaid Other | Admitting: Obstetrics and Gynecology

## 2020-10-28 ENCOUNTER — Encounter: Payer: Self-pay | Admitting: Obstetrics and Gynecology

## 2020-10-28 ENCOUNTER — Other Ambulatory Visit: Payer: Self-pay

## 2020-10-28 ENCOUNTER — Other Ambulatory Visit: Payer: Medicaid Other

## 2020-10-28 VITALS — BP 128/90 | HR 105 | Wt 186.3 lb

## 2020-10-28 DIAGNOSIS — F410 Panic disorder [episodic paroxysmal anxiety] without agoraphobia: Secondary | ICD-10-CM

## 2020-10-28 DIAGNOSIS — Z3403 Encounter for supervision of normal first pregnancy, third trimester: Secondary | ICD-10-CM

## 2020-10-28 DIAGNOSIS — Z3A37 37 weeks gestation of pregnancy: Secondary | ICD-10-CM

## 2020-10-28 LAB — POCT URINALYSIS DIPSTICK OB
Bilirubin, UA: NEGATIVE
Blood, UA: NEGATIVE
Glucose, UA: NEGATIVE
Ketones, UA: NEGATIVE
Leukocytes, UA: NEGATIVE
Nitrite, UA: NEGATIVE
POC,PROTEIN,UA: NEGATIVE
Spec Grav, UA: 1.02 (ref 1.010–1.025)
Urobilinogen, UA: 0.2 E.U./dL
pH, UA: 6 (ref 5.0–8.0)

## 2020-10-28 LAB — FETAL NONSTRESS TEST

## 2020-10-28 MED ORDER — CLONAZEPAM 1 MG PO TABS: 1.0000 mg | ORAL_TABLET | Freq: Two times a day (BID) | ORAL | 0 refills | Status: DC | PRN

## 2020-10-28 NOTE — Progress Notes (Signed)
ROB: Patient states she is doing well.  Occasional Braxton Hicks contractions.  Reports daily fetal movement.  Klonopin as needed for panic attacks.  Has not yet picked up her HSV medication but says she plans to do it today.  Encouraged to get it today.  Plan weekly NSTs.

## 2020-10-28 NOTE — Progress Notes (Signed)
ROB: She has no concerns. She would like a refill on her Klonopin.

## 2020-11-03 ENCOUNTER — Other Ambulatory Visit: Payer: Medicaid Other

## 2020-11-03 NOTE — Progress Notes (Deleted)
Pt was a "No Show" today at [facility]

## 2020-11-04 ENCOUNTER — Other Ambulatory Visit: Payer: Medicaid Other

## 2020-11-04 ENCOUNTER — Encounter: Payer: Self-pay | Admitting: Obstetrics and Gynecology

## 2020-11-04 ENCOUNTER — Ambulatory Visit (INDEPENDENT_AMBULATORY_CARE_PROVIDER_SITE_OTHER): Payer: Medicaid Other | Admitting: Obstetrics and Gynecology

## 2020-11-04 ENCOUNTER — Other Ambulatory Visit: Payer: Self-pay

## 2020-11-04 VITALS — BP 130/86 | HR 120 | Wt 188.4 lb

## 2020-11-04 DIAGNOSIS — Z3A38 38 weeks gestation of pregnancy: Secondary | ICD-10-CM

## 2020-11-04 DIAGNOSIS — Z8619 Personal history of other infectious and parasitic diseases: Secondary | ICD-10-CM | POA: Diagnosis not present

## 2020-11-04 DIAGNOSIS — O365930 Maternal care for other known or suspected poor fetal growth, third trimester, not applicable or unspecified: Secondary | ICD-10-CM

## 2020-11-04 DIAGNOSIS — Z3689 Encounter for other specified antenatal screening: Secondary | ICD-10-CM

## 2020-11-04 DIAGNOSIS — F32A Depression, unspecified: Secondary | ICD-10-CM

## 2020-11-04 DIAGNOSIS — F419 Anxiety disorder, unspecified: Secondary | ICD-10-CM

## 2020-11-04 DIAGNOSIS — Z3483 Encounter for supervision of other normal pregnancy, third trimester: Secondary | ICD-10-CM

## 2020-11-04 LAB — POCT URINALYSIS DIPSTICK OB
Bilirubin, UA: NEGATIVE
Blood, UA: NEGATIVE
Glucose, UA: NEGATIVE
Ketones, UA: NEGATIVE
Leukocytes, UA: NEGATIVE
Nitrite, UA: NEGATIVE
Spec Grav, UA: 1.01 (ref 1.010–1.025)
Urobilinogen, UA: 0.2 E.U./dL
pH, UA: 6.5 (ref 5.0–8.0)

## 2020-11-04 NOTE — Progress Notes (Signed)
ROB: Patient with no major complaints today.  Arrived late to MFM ultrasound yesterday, unable to perform official growth scan (due to borderline growth noted on last scan, 11 %ile). Unofficial imaging notes femur and HC at 11%ile but AC at 35%ile.  Less concern for growth restriction.  NST performed today was reviewed and was found to be reactive (borderline). Is taking HSV suppression. Continue recommended antenatal testing and prenatal care.   NONSTRESS TEST INTERPRETATION  INDICATIONS: Suspected growth restriction (borderline)  FHR baseline: 145 bpm RESULTS:Reactive (after acoustic stimulation and PO hydration and extended monitoring, almost 1 hour).  COMMENTS: 1 contraction, uterine irritability   PLAN: 1. Continue fetal kick counts twice a day. 2. Continue antepartum testing as scheduled-weekly.

## 2020-11-04 NOTE — Patient Instructions (Signed)
Nonstress Test A nonstress test is a procedure that is done during pregnancy in order to check the baby's heartbeat. The procedure can help to show if the baby (fetus) is healthy. It is commonly done when:  The baby is past his or her due date.  The pregnancy is high risk.  The baby is moving less than normal.  The mother has lost a pregnancy in the past.  The health care provider suspects a problem with the baby's growth.  There is too much or too little amniotic fluid. The procedure is often done in the third trimester of pregnancy to find out if an early delivery is needed and whether such a delivery is safe. During a nonstress test, the baby's heartbeat is monitored when the baby is resting and when the baby is moving. If the baby is healthy, the heart rate will increase when he or she moves or kicks and will return to normal when he or she rests. Tell a health care provider about:  Any allergies you have.  Any medical conditions you have.  All medicines you are taking, including vitamins, herbs, eye drops, creams, and over-the-counter medicines.  Any surgeries you have had.  Any past pregnancies you have had. What are the risks? There are no risks to you or your baby from a nonstress test. This procedure should not be painful or uncomfortable. What happens before the procedure?  Eat a meal right before the test or as directed by your health care provider. Food may help encourage the baby to move.  Use the restroom right before the test. What happens during the procedure?  Two monitors will be placed on your abdomen. One will record the baby's heart rate and the other will record the contractions of your uterus.  You may be asked to lie down on your side or to sit upright.  You may be given a button to press when you feel your baby move.  Your health care provider will listen to your baby's heartbeat and record it. He or she may also watch your baby's heartbeat on a  screen.  If the baby seems to be sleeping, you may be asked to drink some juice or soda, eat a snack, or change positions. The procedure may vary among health care providers and hospitals.   What can I expect after procedure?  Your health care provider will discuss the test results with you and make recommendations for the future. Depending on the results, your health care provider may order additional tests or another course of action.  If your health care provider gave you any diet or activity instructions, make sure to follow them.  Keep all follow-up visits. This is important. Summary  A nonstress test is a procedure that is done during pregnancy in order to check the baby's heartbeat. The procedure can help show if the baby is healthy.  The procedure is often done in the third trimester of pregnancy to find out if an early delivery is needed and whether such a delivery is safe.  During a nonstress test, the baby's heartbeat is monitored when the baby is resting and when the baby is moving. If the baby is healthy, the heart rate will increase when he or she moves or kicks and will return to normal when he or she rests.  Your health care provider will discuss the test results with you and make recommendations for the future. This information is not intended to replace advice given to you   by your health care provider. Make sure you discuss any questions you have with your health care provider. Document Revised: 04/25/2020 Document Reviewed: 04/25/2020 Elsevier Patient Education  2021 Elsevier Inc.    Fetal Movement Counts Patient Name: ________________________________________________ Patient Due Date: ____________________  What is a fetal movement count? A fetal movement count is the number of times that you feel your baby move during a certain amount of time. This may also be called a fetal kick count. A fetal movement count is recommended for every pregnant woman. You may be asked  to start counting fetal movements as early as week 28 of your pregnancy. Pay attention to when your baby is most active. You may notice your baby's sleep and wake cycles. You may also notice things that make your baby move more. You should do a fetal movement count: When your baby is normally most active. At the same time each day. A good time to count movements is while you are resting, after having something to eat and drink. How do I count fetal movements? Find a quiet, comfortable area. Sit, or lie down on your side. Write down the date, the start time and stop time, and the number of movements that you felt between those two times. Take this information with you to your health care visits. Write down your start time when you feel the first movement. Count kicks, flutters, swishes, rolls, and jabs. You should feel at least 10 movements. You may stop counting after you have felt 10 movements, or if you have been counting for 2 hours. Write down the stop time. If you do not feel 10 movements in 2 hours, contact your health care provider for further instructions. Your health care provider may want to do additional tests to assess your baby's well-being. Contact a health care provider if: You feel fewer than 10 movements in 2 hours. Your baby is not moving like he or she usually does. Date: ____________ Start time: ____________ Stop time: ____________ Movements: ____________ Date: ____________ Start time: ____________ Stop time: ____________ Movements: ____________ Date: ____________ Start time: ____________ Stop time: ____________ Movements: ____________ Date: ____________ Start time: ____________ Stop time: ____________ Movements: ____________ Date: ____________ Start time: ____________ Stop time: ____________ Movements: ____________ Date: ____________ Start time: ____________ Stop time: ____________ Movements: ____________ Date: ____________ Start time: ____________ Stop time: ____________  Movements: ____________ Date: ____________ Start time: ____________ Stop time: ____________ Movements: ____________ Date: ____________ Start time: ____________ Stop time: ____________ Movements: ____________ This information is not intended to replace advice given to you by your health care provider. Make sure you discuss any questions you have with your health care provider. Document Revised: 03/05/2019 Document Reviewed: 03/05/2019 Elsevier Patient Education  2021 [employer].

## 2020-11-04 NOTE — Progress Notes (Signed)
OB-pt present for routine prenatal care and NST.  

## 2020-11-10 ENCOUNTER — Other Ambulatory Visit: Payer: Medicaid Other

## 2020-11-10 ENCOUNTER — Encounter: Payer: Medicaid Other | Admitting: Obstetrics and Gynecology

## 2020-11-12 ENCOUNTER — Encounter: Payer: Self-pay | Admitting: Obstetrics and Gynecology

## 2020-11-12 ENCOUNTER — Other Ambulatory Visit: Payer: Self-pay

## 2020-11-12 ENCOUNTER — Inpatient Hospital Stay
Admission: EM | Admit: 2020-11-12 | Discharge: 2020-11-14 | DRG: 806 | Disposition: A | Discharge status: Home / Self Care | Payer: Medicaid Other | Attending: Obstetrics and Gynecology | Admitting: Obstetrics and Gynecology

## 2020-11-12 DIAGNOSIS — Z3A39 39 weeks gestation of pregnancy: Secondary | ICD-10-CM

## 2020-11-12 DIAGNOSIS — F32A Depression, unspecified: Secondary | ICD-10-CM | POA: Diagnosis not present

## 2020-11-12 DIAGNOSIS — O99344 Other mental disorders complicating childbirth: Secondary | ICD-10-CM | POA: Diagnosis present

## 2020-11-12 DIAGNOSIS — A6000 Herpesviral infection of urogenital system, unspecified: Secondary | ICD-10-CM | POA: Diagnosis not present

## 2020-11-12 DIAGNOSIS — F419 Anxiety disorder, unspecified: Secondary | ICD-10-CM | POA: Diagnosis not present

## 2020-11-12 DIAGNOSIS — Z20822 Contact with and (suspected) exposure to covid-19: Secondary | ICD-10-CM | POA: Diagnosis not present

## 2020-11-12 DIAGNOSIS — O9832 Other infections with a predominantly sexual mode of transmission complicating childbirth: Secondary | ICD-10-CM | POA: Diagnosis not present

## 2020-11-12 DIAGNOSIS — O26893 Other specified pregnancy related conditions, third trimester: Secondary | ICD-10-CM | POA: Diagnosis not present

## 2020-11-12 LAB — CBC
HCT: 34.9 % — ABNORMAL LOW (ref 36.0–46.0)
Hemoglobin: 11.9 g/dL — ABNORMAL LOW (ref 12.0–15.0)
MCH: 30.4 pg (ref 26.0–34.0)
MCHC: 34.1 g/dL (ref 30.0–36.0)
MCV: 89 fL (ref 80.0–100.0)
Platelets: 339 10*3/uL (ref 150–400)
RBC: 3.92 MIL/uL (ref 3.87–5.11)
RDW: 13.5 % (ref 11.5–15.5)
WBC: 21.8 10*3/uL — ABNORMAL HIGH (ref 4.0–10.5)
nRBC: 0 % (ref 0.0–0.2)

## 2020-11-12 LAB — TYPE AND SCREEN
ABO/RH(D): A POS
Antibody Screen: NEGATIVE

## 2020-11-12 LAB — RESP PANEL BY RT-PCR (FLU A&B, COVID) ARPGX2
Influenza A by PCR: NEGATIVE
Influenza B by PCR: NEGATIVE
SARS Coronavirus 2 by RT PCR: NEGATIVE

## 2020-11-12 LAB — ABO/RH: ABO/RH(D): A POS

## 2020-11-12 MED ORDER — OXYTOCIN-SODIUM CHLORIDE 30-0.9 UT/500ML-% IV SOLN
2.5000 [IU]/h | INTRAVENOUS | Status: DC
  Administered 2020-11-12: 2.5 [IU]/h via INTRAVENOUS

## 2020-11-12 MED ORDER — AMMONIA AROMATIC IN INHA
RESPIRATORY_TRACT | Status: AC
  Filled 2020-11-12: qty 10

## 2020-11-12 MED ORDER — CLONAZEPAM 0.5 MG PO TABS: 1.0000 mg | ORAL_TABLET | Freq: Every day | ORAL | Status: DC | PRN

## 2020-11-12 MED ORDER — LIDOCAINE HCL (PF) 1 % IJ SOLN
30.0000 mL | INTRAMUSCULAR | Status: AC | PRN
  Administered 2020-11-12: 30 mL via SUBCUTANEOUS

## 2020-11-12 MED ORDER — DIPHENHYDRAMINE HCL 25 MG PO CAPS: 25.0000 mg | ORAL_CAPSULE | Freq: Four times a day (QID) | ORAL | Status: DC | PRN

## 2020-11-12 MED ORDER — IBUPROFEN 600 MG PO TABS
600.0000 mg | ORAL_TABLET | Freq: Four times a day (QID) | ORAL | Status: DC
  Administered 2020-11-12 – 2020-11-13 (×5): 600 mg via ORAL
  Filled 2020-11-12 (×6): qty 1

## 2020-11-12 MED ORDER — DOCUSATE SODIUM 100 MG PO CAPS
100.0000 mg | ORAL_CAPSULE | Freq: Two times a day (BID) | ORAL | Status: DC
  Administered 2020-11-12 – 2020-11-14 (×4): 100 mg via ORAL
  Filled 2020-11-12 (×4): qty 1

## 2020-11-12 MED ORDER — BENZOCAINE-MENTHOL 20-0.5 % EX AERO
1.0000 "application " | INHALATION_SPRAY | CUTANEOUS | Status: DC | PRN
  Administered 2020-11-12: 1 via TOPICAL
  Filled 2020-11-12: qty 56

## 2020-11-12 MED ORDER — BUTORPHANOL TARTRATE 1 MG/ML IJ SOLN: 1.0000 mg | INTRAMUSCULAR | Status: DC | PRN

## 2020-11-12 MED ORDER — OXYTOCIN 10 UNIT/ML IJ SOLN
INTRAMUSCULAR | Status: AC
  Filled 2020-11-12: qty 2

## 2020-11-12 MED ORDER — SODIUM CHLORIDE 0.9 % IV SOLN: 1.0000 g | INTRAVENOUS | Status: DC

## 2020-11-12 MED ORDER — OXYTOCIN-SODIUM CHLORIDE 30-0.9 UT/500ML-% IV SOLN
INTRAVENOUS | Status: AC
  Filled 2020-11-12: qty 500

## 2020-11-12 MED ORDER — OXYTOCIN BOLUS FROM INFUSION
333.0000 mL | Freq: Once | INTRAVENOUS | Status: AC
  Administered 2020-11-12: 333 mL via INTRAVENOUS

## 2020-11-12 MED ORDER — OXYTOCIN-SODIUM CHLORIDE 30-0.9 UT/500ML-% IV SOLN: 2.5000 [IU]/h | INTRAVENOUS | Status: DC | PRN

## 2020-11-12 MED ORDER — OXYCODONE-ACETAMINOPHEN 5-325 MG PO TABS
1.0000 | ORAL_TABLET | ORAL | Status: DC | PRN
Start: 2020-11-12 — End: 2020-11-14

## 2020-11-12 MED ORDER — MISOPROSTOL 200 MCG PO TABS
ORAL_TABLET | ORAL | Status: AC
  Filled 2020-11-12: qty 4

## 2020-11-12 MED ORDER — SODIUM CHLORIDE 0.9 % IV SOLN: 2.0000 g | Freq: Once | INTRAVENOUS | Status: DC

## 2020-11-12 MED ORDER — LACTATED RINGERS IV SOLN: INTRAVENOUS | Status: DC

## 2020-11-12 MED ORDER — ZOLPIDEM TARTRATE 5 MG PO TABS: 5.0000 mg | ORAL_TABLET | Freq: Every evening | ORAL | Status: DC | PRN

## 2020-11-12 MED ORDER — VENLAFAXINE HCL ER 150 MG PO CP24
150.0000 mg | ORAL_CAPSULE | Freq: Every day | ORAL | Status: AC
  Administered 2020-11-12 – 2020-11-13 (×2): 150 mg via ORAL
  Filled 2020-11-12 (×2): qty 1

## 2020-11-12 MED ORDER — PRENATAL MULTIVITAMIN CH
1.0000 | ORAL_TABLET | Freq: Every day | ORAL | Status: DC
  Administered 2020-11-13 – 2020-11-14 (×2): 1 via ORAL
  Filled 2020-11-12 (×2): qty 1

## 2020-11-12 MED ORDER — SOD CITRATE-CITRIC ACID 500-334 MG/5ML PO SOLN: 30.0000 mL | ORAL | Status: DC | PRN

## 2020-11-12 MED ORDER — VENLAFAXINE HCL ER 75 MG PO CP24
75.0000 mg | ORAL_CAPSULE | Freq: Every day | ORAL | Status: AC
  Administered 2020-11-12 – 2020-11-13 (×2): 75 mg via ORAL
  Filled 2020-11-12 (×2): qty 1

## 2020-11-12 MED ORDER — SIMETHICONE 80 MG PO CHEW: 80.0000 mg | CHEWABLE_TABLET | ORAL | Status: DC | PRN

## 2020-11-12 MED ORDER — ACETAMINOPHEN 325 MG PO TABS: 650.0000 mg | ORAL_TABLET | ORAL | Status: DC | PRN

## 2020-11-12 MED ORDER — TETANUS-DIPHTH-ACELL PERTUSSIS 5-2.5-18.5 LF-MCG/0.5 IM SUSY
0.5000 mL | PREFILLED_SYRINGE | Freq: Once | INTRAMUSCULAR | Status: DC
  Filled 2020-11-12: qty 0.5

## 2020-11-12 MED ORDER — LIDOCAINE HCL (PF) 1 % IJ SOLN
INTRAMUSCULAR | Status: AC
  Filled 2020-11-12: qty 30

## 2020-11-12 MED ORDER — BUTORPHANOL TARTRATE 1 MG/ML IJ SOLN
INTRAMUSCULAR | Status: AC
  Administered 2020-11-12: 1 mg
  Filled 2020-11-12: qty 1

## 2020-11-12 MED ORDER — LACTATED RINGERS IV SOLN: 500.0000 mL | INTRAVENOUS | Status: DC | PRN

## 2020-11-12 MED ORDER — ONDANSETRON HCL 4 MG/2ML IJ SOLN
4.0000 mg | Freq: Four times a day (QID) | INTRAMUSCULAR | Status: DC | PRN
  Administered 2020-11-12: 4 mg via INTRAVENOUS
  Filled 2020-11-12: qty 2

## 2020-11-12 NOTE — OB Triage Note (Signed)
Pt is a G3P0 and [redacted]w[redacted]d presenting to L&D with c/o ctx every 3-8 minutes apart since 0130. Pt reports ctx pain /10 on a 0-10 pain scale. Pt denies LOF or VB and confirms positive fetal movement. Pt denies recent intercourse. VSS. Monitors applied and assessing.
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
## 2020-11-13 LAB — RPR: RPR Ser Ql: NONREACTIVE

## 2020-11-13 LAB — HIV ANTIBODY (ROUTINE TESTING W REFLEX): HIV Screen 4th Generation wRfx: NONREACTIVE

## 2020-11-13 NOTE — Progress Notes (Signed)
Progress Note - Vaginal Delivery  Rachel Mercer is a 25 y.o. G3P1021 now PP day 1 s/p Vaginal, Spontaneous .   Subjective:  The patient reports no complaints, up ad lib, voiding and tolerating PO Wants to stay as long as possible - baby in NICU  Objective:  Vital signs in last 24 hours: Temp:  [98 F (36.7 C)-98.5 F (36.9 C)] 98.4 F (36.9 C) (04/17 0744) Pulse Rate:  [85-104] 96 (04/17 0744) Resp:  [18] 18 (04/17 0744) BP: (117-142)/(77-99) 142/99 (04/17 0744) SpO2:  [96 %-99 %] 99 % (04/17 0744)  Physical Exam:  General: alert and cooperative Lochia: appropriate    Data Review Recent Labs    11/12/20 0550  HGB 11.9*  HCT 34.9*    Assessment/Plan: Active Problems:   Normal labor   Plan for discharge tomorrow   -- Continue routine PP care.     Elonda Husky, M.D. 11/13/2020 9:49 AM

## 2020-11-13 NOTE — Lactation Note (Signed)
This note was copied from a baby's chart. Lactation Consultation Note  Patient Name: Rachel Mercer [id number] Date: 11/13/2020 Reason for consult: Initial assessment;1st time breastfeeding;NICU baby Age:26 hours Baby remains in NICU, Mother is pumping in her room, has a pump at home, unsure of brand and has ordered a pump through insurance. Mother has manual at bedside as well. Reviewed manual at home, encouraged Mother to try home pump if not working well discussed rental or loaner with Symphony pump. Encouraged pumping 8x's/24 hours, initiate versus maintenance program and cleaning and set up of supplies. Mother stated no questions at this time. LC encouraged Mother to place one pump kit at baby's bedside and to pump when she is here. Discussed outpatient # for questions at home or for an appt. Mother stated understanding with all teaching.  Maternal Data Has patient been taught Hand Expression?: Yes Does the patient have breastfeeding experience prior to this delivery?: No  Feeding Mother's Current Feeding Choice: Breast Milk  Lactation Tools Discussed/Used Tools: Pump Breast pump type: Double-Electric Breast Pump;Manual (Symphony at bedside, will take manual home, has a DEBP at home) Pump Education: Setup, frequency, and cleaning;Milk Storage Reason for Pumping: NICU admission Pumping frequency: encourgaed 8x's/24hours Pumped volume: 15 mL  Interventions Interventions: Hand pump;DEBP;Education;Hand express;Breast feeding basics reviewed  Discharge Discharge Education: Engorgement and breast care Pump: DEBP;Manual;Personal WIC Program: Yes  Consult Status Consult Status: Follow-up Date: 11/14/20 Follow-up type: Call as needed    Rachel Mercer D Rachel Mercer 11/13/2020, 11:59 AM

## 2020-11-14 ENCOUNTER — Inpatient Hospital Stay: Admit: 2020-11-14 | Payer: Self-pay

## 2020-11-14 ENCOUNTER — Encounter: Payer: Medicaid Other | Admitting: Obstetrics and Gynecology

## 2020-11-14 ENCOUNTER — Other Ambulatory Visit: Payer: Medicaid Other

## 2020-11-14 MED ORDER — IBUPROFEN 600 MG PO TABS
600.0000 mg | ORAL_TABLET | Freq: Four times a day (QID) | ORAL | Status: DC
  Administered 2020-11-14: 600 mg via ORAL
  Filled 2020-11-14: qty 1

## 2020-11-14 NOTE — Progress Notes (Signed)
Pt discharged. Infant in SCN.  Discharge instructions, prescriptions and follow up appointment given to and reviewed with pt. Pt verbalized understanding. Escorted out by auxillary. 

## 2020-11-14 NOTE — Discharge Summary (Signed)
Patient Name: Rachel Mercer DOB: 01-Jan-1996 MRN: [id number]                            Discharge Summary  Date of Admission: 11/12/2020 Date of Discharge: 11/14/2020 Delivering Provider: Linzie Collin   Admitting Diagnosis: Normal labor [O80, Z37.9] at [redacted]w[redacted]d Secondary diagnosis:  Active Problems:   Normal labor   Mode of Delivery: normal spontaneous vaginal delivery              Discharge diagnosis: Term Pregnancy Delivered      Intrapartum Procedures: Atificial rupture of membranes and laceration labial   Post partum procedures:   Complications: none                     Discharge Day SOAP Note:  Progress Note - Vaginal Delivery  Rachel Mercer is a 25 y.o. [id number] now PP day 2 s/p Vaginal, Spontaneous . Delivery was uncomplicated.  Subjective  The patient has the following complaints: has no unusual complaints  Pain is controlled with current medications.   Patient is urinating without difficulty.  She is ambulating well.     Objective  Vital signs: BP 127/82 (BP Location: Right Arm)   Pulse 80   Temp 97.9 F (36.6 C) (Oral)   Resp 18   Ht 5\' 5"  (1.651 m)   Wt 83.5 kg   LMP 02/08/2020 (Exact Date)   SpO2 97%   Breastfeeding Unknown   BMI 30.62 kg/m   Physical Exam: Gen: NAD Fundus Fundal Tone: Firm  Lochia Amount: Scant        Data Review Labs: Lab Results  Component Value Date   WBC 21.8 (H) 11/12/2020   HGB 11.9 (L) 11/12/2020   HCT 34.9 (L) 11/12/2020   MCV 89.0 11/12/2020   PLT 339 11/12/2020   CBC Latest Ref Rng & Units 11/12/2020 09/13/2020 05/05/2020  WBC 4.0 - 10.5 K/uL 21.8(H) 16.6(H) -  Hemoglobin 12.0 - 15.0 g/dL 11.9(L) 12.5 13.1  Hematocrit 36.0 - 46.0 % 34.9(L) 37.2 38  Platelets 150 - 400 K/uL 339 313 321   A POS Performed at [facility], [address] [place], [place]   [address] Score: Inocente Salles Postnatal Depression Scale Screening Tool 11/12/2020  I have been able to laugh and see  the funny side of things. 0  I have looked forward with enjoyment to things. 0  I have blamed myself unnecessarily when things went wrong. 2  I have been anxious or worried for no good reason. 2  I have felt scared or panicky for no good reason. 2  Things have been getting on top of me. 1  I have been so unhappy that I have had difficulty sleeping. 0  I have felt sad or miserable. 1  I have been so unhappy that I have been crying. 1  The thought of harming myself has occurred to me. 0  Edinburgh Postnatal Depression Scale Total 9    Assessment/Plan  Active Problems:   Normal labor    Plan for discharge today.  Discharge Instructions: Per After Visit Summary. Activity: Advance as tolerated. Pelvic rest for 6 weeks.  Also refer to After Visit Summary Diet: Regular Medications: Allergies as of 11/14/2020   No Known Allergies     Medication List    STOP taking these medications   albuterol 108 (90 Base) MCG/ACT inhaler Commonly known as: Ventolin  HFA   metoCLOPramide 10 MG tablet Commonly known as: REGLAN   pantoprazole 20 MG tablet Commonly known as: Protonix   valACYclovir 500 MG tablet Commonly known as: VALTREX     TAKE these medications   calcium carbonate 750 MG chewable tablet Commonly known as: TUMS EX Chew 1 tablet by mouth daily.   clonazePAM 1 MG tablet Commonly known as: KlonoPIN Take 1 tablet (1 mg total) by mouth 2 (two) times daily as needed for anxiety.   PRENATAL VITAMINS PO Take by mouth.   venlafaxine XR 150 MG 24 hr capsule Commonly known as: Effexor XR Take one pill daily along with 75 mg tablet to total 225 mg daily.   venlafaxine XR 75 MG 24 hr capsule Commonly known as: Effexor XR Take one pill daily along with 150 mg tablet to total 225 mg daily.      Outpatient follow up:   Follow-up Information    Linzie Collin, MD Follow up in 4 week(s).   Specialties: Obstetrics and Gynecology, Radiology Contact information: [address] [place] [address] [phone]              Postpartum contraception: Will discuss at first office visit post-partum  Discharged Condition: good  Discharged to: home  Newborn Data: Disposition:NICU  Apgars: APGAR (1 MIN): 8   APGAR (5 MINS): 9   APGAR (10 MINS):    Baby Feeding: Pumping    Elonda Husky, M.D. 11/14/2020 8:30 AM

## 2020-11-14 NOTE — Lactation Note (Addendum)
This note was copied from a baby's chart. Lactation Consultation Note  Patient Name: Rachel Mercer [id number] Date: 11/14/2020 Reason for consult: Follow-up assessment;1st time breastfeeding;NICU baby;Term Age:26 hours  LC Student arrived in the room where MOB and FOB were present. MOB stated everything has been going well and she does not have any questions or concerns. She stated she has been pumping and plans to continue pumping. Her last time pumping was sometime last night. [facility] Student educated her about supply and demand and the importance of stimulating the breast. She stated she understood. MOB inquired about why her infant was being fed formula through her tube because she wanted to breastfeed. [facility] Student told her that she would inquire about that and get back with her. MOB stated she understood and that she was about to pump and go see the infant in the NICU. [facility] Student informed her to call if she needed any further assistance.   Maternal Data Has patient been taught Hand Expression?: Yes Does the patient have breastfeeding experience prior to this delivery?: No  Feeding Mother's Current Feeding Choice: Breast Milk    Lactation Tools Discussed/Used Tools: 29F feeding tube / Syringe   Consult Status Consult Status: Follow-up Date: 11/15/20    [place] Katrinka Blazing 11/14/2020, 10:09 AM

## 2020-11-14 NOTE — Lactation Note (Signed)
This note was copied from a baby's chart. Lactation Consultation Note  Patient Name: Rachel Mercer Date: 11/14/2020 Reason for consult: Follow-up assessment;1st time breastfeeding;NICU baby;Term Age:26 hours   Mother was asking Lactation why her baby was given formula overnight. Lactation to the room after speaking with SCN RN. RN stated they did not have Mother's milk and baby is term and was given formula. Mother is requesting DBM to be used instead. She was currently pumping and had 18ml's expressed in the bottles. Mother signed [facility] consent form. Mother's milk was taken to the SCN while she continued to pump. Mother is appreciative of all assistance.   Maternal Data Has patient been taught Hand Expression?: Yes Does the patient have breastfeeding experience prior to this delivery?: No  Feeding Mother's Current Feeding Choice: Breast Milk  Lactation Tools Discussed/Used Tools: 29F feeding tube / Syringe  Consult Status Consult Status: Follow-up Date: 11/15/20    Rachel Mercer 11/14/2020, 11:25 AM

## 2020-11-14 NOTE — Discharge Instructions (Signed)
Postpartum Care After Vaginal Delivery The following information offers guidance about how to care for yourself from the time you deliver your baby to 6-12 weeks after delivery (postpartum period). If you have problems or questions, contact your health care provider for more specific instructions. Follow these instructions at home: Vaginal bleeding  It is normal to have vaginal bleeding (lochia) after delivery. Wear a sanitary pad for bleeding and discharge. ? During the first week after delivery, the amount and appearance of lochia is often similar to a menstrual period. ? Over the next few weeks, it will gradually decrease to a dry, yellow-brown discharge. ? For most women, lochia stops completely by 4-6 weeks after delivery, but can vary.  Change your sanitary pads frequently. Watch for any changes in your flow, such as: ? A sudden increase in volume. ? A change in color. ? Large blood clots.  If you pass a blood clot from your vagina, save it and call your health care provider. Do not flush blood clots down the toilet before talking with your health care provider.  Do not use tampons or douches until your health care provider approves.  If you are not breastfeeding, your period should return 6-8 weeks after delivery. If you are feeding your baby breast milk only, your period may not return until you stop breastfeeding. Perineal care  Keep the area between the vagina and the anus (perineum) clean and dry. Use medicated pads and pain-relieving sprays and creams as directed.  If you had a surgical cut in the perineum (episiotomy) or a tear, check the area for signs of infection until you are healed. Check for: ? More redness, swelling, or pain. ? Fluid or blood coming from the cut or tear. ? Warmth. ? Pus or a bad smell.  You may be given a squirt bottle to use instead of wiping to clean the perineum area after you use the bathroom. Pat the area gently to dry it.  To relieve pain  caused by an episiotomy, a tear, or swollen veins in the anus (hemorrhoids), take a warm sitz bath 2-3 times a day. In a sitz bath, the warm water should only come up to your hips and cover your buttocks.   Breast care  In the first few days after delivery, your breasts may feel heavy, full, and uncomfortable (breast engorgement). Milk may also leak from your breasts. Ask your health care provider about ways to help relieve the discomfort.  If you are breastfeeding: ? Wear a bra that supports your breasts and fits well. Use breast pads to absorb milk that leaks. ? Keep your nipples clean and dry. Apply creams and ointments as told. ? You may have uterine contractions every time you breastfeed for up to several weeks after delivery. This helps your uterus return to its normal size. ? If you have any problems with breastfeeding, notify your health care provider or lactation consultant.  If you are not breastfeeding: ? Avoid touching your breasts. Do not squeeze out (express) milk. Doing this can make your breasts produce more milk. ? Wear a good-fitting bra and use cold packs to help with swelling. Intimacy and sexuality  Ask your health care provider when you can engage in sexual activity. This may depend upon: ? Your risk of infection. ? How fast you are healing. ? Your comfort and desire to engage in sexual activity.  You are able to get pregnant after delivery, even if you have not had your period. Talk with   your health care provider about methods of birth control (contraception) or family planning if you desire future pregnancies. Medicines  Take over-the-counter and prescription medicines only as told by your health care provider.  Take an over-the-counter stool softener to help ease bowel movements as told by your health care provider.  If you were prescribed an antibiotic medicine, take it as told by your health care provider. Do not stop taking the antibiotic even if you start to  feel better.  Review all previous and current prescriptions to check for possible transfer into breast milk. Activity  Gradually return to your normal activities as told by your health care provider.  Rest as much as possible. Nap while your baby is sleeping. Eating and drinking  Drink enough fluid to keep your urine pale yellow.  To help prevent or relieve constipation, eat high-fiber foods every day.  Choose healthy eating to support breastfeeding or weight loss goals.  Take your prenatal vitamins until your health care provider tells you to stop.   General tips/recommendations  Do not use any products that contain nicotine or tobacco. These products include cigarettes, chewing tobacco, and vaping devices, such as e-cigarettes. If you need help quitting, ask your health care provider.  Do not drink alcohol, especially if you are breastfeeding.  Do not take medications or drugs that are not prescribed to you, especially if you are breastfeeding.  Visit your health care provider for a postpartum checkup within the first 3-6 weeks after delivery.  Complete a comprehensive postpartum visit no later than 12 weeks after delivery.  Keep all follow-up visits for you and your baby. Contact a health care provider if:  You feel unusually sad or worried.  Your breasts become red, painful, or hard.  You have a fever or other signs of an infection.  You have bleeding that is soaking through one pad an hour or you have blood clots.  You have a severe headache that doesn't go away or you have vision changes.  You have nausea and vomiting and are unable to eat or drink anything for 24 hours. Get help right away if:  You have chest pain or difficulty breathing.  You have sudden, severe leg pain.  You faint or have a seizure.  You have thoughts about hurting yourself or your baby. If you ever feel like you may hurt yourself or others, or have thoughts about taking your own life,  get help right away. Go to your nearest emergency department or:  Call your local emergency services (911 in the [place]  The National Suicide Prevention Lifeline at 984-373-1569. This suicide crisis helpline is open 24 hours a day.  Text the Crisis Text Line at 309 101 5255 (in the [place] Summary  The period of time after you deliver your newborn up to 6-12 weeks after delivery is called the postpartum period.  Keep all follow-up visits for you and your baby.  Review all previous and current prescriptions to check for possible transfer into breast milk.  Contact a health care provider if you feel unusually sad or worried during the postpartum period. This information is not intended to replace advice given to you by your health care provider. Make sure you discuss any questions you have with your health care provider. Document Revised: 03/31/2020 Document Reviewed: 03/31/2020 Elsevier Patient Education  2021 Elsevier Inc. Breast Pumping Tips Breast pumping is a way to get milk out of your breasts. You will then store the milk for your baby to use when  you are away from home. There are three ways to pump.  You can use your hand to massage and squeeze your breast (hand expression).  You can use a hand-held machine to manually pump your milk.  You can use an electric machine to pump your milk. In the beginning you may not get much milk. After a few days, your breasts should make more. Pumping can help you start making milk after your baby is born. Pumping helps you to keep making milk when you are away from your baby. When should I pump? You can start pumping soon after your baby is born. Follow these tips:  When you are with your baby: ? Pump after you breastfeed. ? Pump from the free breast while you breastfeed.  When you are away from your baby: ? Pump every 2-3 hours for 15 minutes. ? Pump both breasts at the same time if you can.  If your baby drinks formula, pump around the  time your baby gets the formula.  If you drank alcohol, wait 2 hours before you pump.  If you are going to have surgery, ask your doctor when you should pump again. How do I get ready to pump? Try to relax. Try these things to help your milk come in:  Smell your baby's blanket or clothes.  Look at a picture or video of your baby.  Sit in a quiet, private space.  Place a cloth on your breast. The cloth should be warm and a little wet.  Massage your breast and nipple.  Play relaxing music.  Picture your milk flowing.  Drink water and eat a snack. What are some tips? General tips for pumping breast milk  Always wash your hands with soap and water for at least 20 seconds before pumping.  If you do not get much milk or if pumping hurts, try different pump settings or a different kind of pump.  Drink enough fluid so your pee (urine) is clear or pale yellow.  Wear clothing that opens in the front or is easy to take off.  Pump milk into a clean bottle or container.  Do not smoke or use any products that contain nicotine or tobacco. If you need help quitting, ask your doctor.  Try to get a hands-free pumping bra, if possible. This makes it easy to pump breast milk. You can buy one or make your own.   Tips for storing breast milk  Store breast milk in a clean, BPA-free container. These include: ? A glass or plastic bottle. ? A milk storage bag.  Store only 2-4 ounces of breast milk in each container.  Swirl the breast milk in the container. Do not shake it.  Write down the date you pumped the milk on the container.  This is how long you can store breast milk: ? Room temperature: 6-8 hours. It is best to use the milk within 4 hours. ? Cooler with ice packs: 24 hours. ? Refrigerator: 5-8 days, if the milk is clean. It is best to use the milk within 3 days. ? Freezer: 9-12 months, if the milk is clean and stored away from the freezer door. It is best to use the milk within 6  months.  Put milk in the back of the refrigerator or freezer.  Thaw frozen milk using warm water. Do not use the microwave.   Tips for choosing a breast pump When choosing a pump, keep the following things in mind:  Manual breast pumps do  not need electricity. They cost less. They can be hard to use.  Electric breast pumps use electricity. They are more expensive. They are easier to use. They collect more milk.  The suction cup (flange) should be the right size.  Before you buy the pump, check if your insurance will pay for it. Tips for caring for a breast pump  Check the manual that came with your pump for cleaning tips.  Try not to touch the inside of pump parts.  Clean the pump after you use it. To do this: ? Wipe down the electrical part. Use a dry cloth or paper towel. Do not put this part in water or in cleaning products. ? Wash the plastic parts with soap and warm water. Or use the dishwasher if the manual says it is safe. You do not need to clean the tubing unless it touched breast milk. ? Let all the parts air dry. Avoid drying them with a cloth or towel. ? When the parts are clean and dry, put the pump back together. Then store the pump.  If there is water in the tubing when you want to pump: 1. Attach the tubing to the pump. 2. Turn on the pump to dry the tubing. 3. Turn off the pump when the tube is dry. Summary  Pumping can help you start making milk after your baby is born. It lets you keep making milk when you are away from your baby.  When you are away from your baby, pump for about 15 minutes every 2-3 hours. Pump both breasts at the same time, if you can. This information is not intended to replace advice given to you by your health care provider. Make sure you discuss any questions you have with your health care provider. Document Revised: 04/26/2020 Document Reviewed: 04/26/2020 Elsevier Patient Education  2021 [employer].

## 2020-11-15 ENCOUNTER — Ambulatory Visit: Payer: Self-pay

## 2020-11-15 NOTE — Lactation Note (Signed)
This note was copied from a baby's chart. Lactation Consultation Note  Patient Name: Rachel Mercer [id number] Date: 11/15/2020 Reason for consult: Follow-up assessment;1st time breastfeeding;NICU baby;Term Age:26 days   Lactation at [facility] bedside to assist mom with first feeding attempt at the breast. Baby is day 3, has been in SCN due to respiratory distress, and mom has been pumping every 3 hours, volumes above 69mL/pumping session.  Left breast has inverted/flat nipple, milk easily expressed and baby attempting to latch and is eager. Baby can latch but sustaining latch is difficult. Size 36mm NS applied, and breast massaged/compressed to place milk in shield.  Baby accepted the breast/nipple shield and had 1-2 swallows, but was tired and then sleepy quickly. Multiple attempts made to reawaken, baby removed from swaddle, still baby remained sleepy. Feeding efforts did not last past 10 minutes.  LC assisted mom with pump set-up at bedside, discussed pumping efforts at home, answered questions, and praised mom for dedication to providing breastmilk.  Plan would be to attempt feeding on opposite breast next feeding time when mom is present. Mom reports that her right nipple is more everted than her left; may have better success. Encouraged continued frequent pumping efforts to continue building and protecting supply and prevention of engorgement. Briefly reviewed milk storage and transport.  Maternal Data Has patient been taught Hand Expression?: Yes Does the patient have breastfeeding experience prior to this delivery?: No  Feeding Mother's Current Feeding Choice: Breast Milk Nipple Type: Slow - flow  LATCH Score Latch: Repeated attempts needed to sustain latch, nipple held in mouth throughout feeding, stimulation needed to elicit sucking reflex. (Simultaneous filing. User may not have seen previous data.)  Audible Swallowing: A few with stimulation (Simultaneous filing. User may not  have seen previous data.)  Type of Nipple: Flat (left breast  Simultaneous filing. User may not have seen previous data.)  Comfort (Breast/Nipple): Soft / non-tender (Simultaneous filing. User may not have seen previous data.)  Hold (Positioning): Assistance needed to correctly position infant at breast and maintain latch. (Simultaneous filing. User may not have seen previous data.)  LATCH Score: 6 (Simultaneous filing. User may not have seen previous data.)   Lactation Tools Discussed/Used Tools: Nipple Dorris Carnes;Pump Nipple shield size: 20 Breast pump type: Double-Electric Breast Pump Reason for Pumping: baby in SCN Pumping frequency: every 3 hours Pumped volume: 90 mL (or more per pumping session)  Interventions Interventions: Breast feeding basics reviewed;Assisted with latch;Hand express;Breast massage;Reverse pressure;Breast compression;Adjust position;Support pillows;Position options;Education;DEBP (Simultaneous filing. User may not have seen previous data.)  Discharge Pump: Personal  Consult Status Consult Status: Follow-up Date: 11/15/20 Follow-up type: Call as needed    Danford Bad 11/15/2020, 3:26 PM

## 2020-11-16 ENCOUNTER — Ambulatory Visit: Payer: Self-pay

## 2020-11-16 NOTE — Lactation Note (Signed)
This note was copied from a baby's chart. Lactation Consultation Note  Patient Name: Rachel Mercer [id number] Date: 11/16/2020 Reason for consult: Follow-up assessment;NICU baby;Term;Other (Comment) (nipple shield) Age:26 days  Lactation present for feeding attempt at the breast with mom and [name]. [name] asleep in bassinet, and continued to be sleepy through vitals and diaper change. Swaddle removed, and mom assisted with comfortable position and support pillows.   Nipple shield placed on Rt nipple- nipple everts with stimulation but is short, and mom's breasts were full- Nipple shield filled with what appears to be mature milk easily while baby was brought to her.  Baby did latch, sucks visible with 1-2 swallows from what was in shield. Baby did not stay vigourous at the breast and needed continued stimulation to stay interested and awake. Baby fed at breast for 7 minutes on/off, before remaining asleep.  LC captured what was leftover in nipple shield and set-up pump for mom to empty breasts. Breast milk stored for future feedings. Mom encouraged to continue with feeding efforts at the breast when ever visiting, provided reassurance with baseline at the breast and progress moving forward, encouraged continued pumping efforts to continue building and/or maintaining supply.  Mom had no questions at this time.  Maternal Data Has patient been taught Hand Expression?: Yes Does the patient have breastfeeding experience prior to this delivery?: No  Feeding Mother's Current Feeding Choice: Breast Milk  LATCH Score Latch: Repeated attempts needed to sustain latch, nipple held in mouth throughout feeding, stimulation needed to elicit sucking reflex.  Audible Swallowing: A few with stimulation  Type of Nipple: Everted at rest and after stimulation (Rt nipple is everted; left is left/inverted)  Comfort (Breast/Nipple): Soft / non-tender  Hold (Positioning): Assistance needed to correctly position  infant at breast and maintain latch.  LATCH Score: 7   Lactation Tools Discussed/Used Tools: Nipple Dorris Carnes;Pump Nipple shield size: 20 Breast pump type: Double-Electric Breast Pump Reason for Pumping: SCN baby Pumping frequency: q 3hrs Pumped volume: 90 mL (or more per pumping session)  Interventions Interventions: Breast feeding basics reviewed;Assisted with latch;Hand express;Reverse pressure;Breast compression;Adjust position;Support pillows;Position options;Education;DEBP  Discharge Pump: DEBP;Personal  Consult Status Consult Status: PRN Date: 11/16/20 Follow-up type: Call as needed    Danford Bad 11/16/2020, 1:07 PM

## 2020-11-17 ENCOUNTER — Ambulatory Visit: Payer: Self-pay

## 2020-11-17 NOTE — Lactation Note (Addendum)
This note was copied from a baby's chart. Lactation Consultation Note  Patient Name: Rachel Mercer [id number] Date: 11/17/2020 Reason for consult: Follow-up assessment;Term;NICU baby;Other (Comment) (nipple shield) Age:26 days  Lactation at bedside to assist parents with position and latch of Rachel Mercer. Rachel Mercer has been accepting the breast well, but is sleepy. Mom has an abundant milk supply, leaks easily with slightest stimulation and is pumping routinely.   Rachel Mercer was slightly more alert/awake this time that the feeding with Lactation yesterday. Baby latched easily (continued use of nipple shield due to semi-flat nipple (R) and inverted nipple (L)) with minimal assistance, but within 2-3 minutes was drowsy and uninterested at the breast. Baby did burp and was placed at the breast with nipple shield filled with milk; baby remained sleepy.  LC assisted with pump set-up, encouraged to continue pumping to empty breasts, and updated RN taking care of Rachel Mercer, who will begin gavage feeding.  LC spoke with [name] re: medications and baby's behavior at the breast. [name] (Dr. Tobin Chad) does not feel that mom's medications have anything to do with baby's behavior/sleepiness. She states that baby, if anything should be more alert/withdrawing from it as mom does not take it every day.   [name] encouraged mom to be available for more frequent feeding attempts throughout the day- Mom plans to try and stay longer tomorrow.  Maternal Data Has patient been taught Hand Expression?: Yes Does the patient have breastfeeding experience prior to this delivery?: No  Feeding Mother's Current Feeding Choice: Breast Milk Nipple Type: Dr. Lorne Skeens  LATCH Score Latch: Grasps breast easily, tongue down, lips flanged, rhythmical sucking.  Audible Swallowing: A few with stimulation  Type of Nipple: Flat  Comfort (Breast/Nipple): Soft / non-tender  Hold (Positioning): Assistance needed to correctly position infant at breast and  maintain latch.  LATCH Score: 7   Lactation Tools Discussed/Used Tools: Pump;Nipple Shields Nipple shield size: 20 Breast pump type: Double-Electric Breast Pump Pump Education: Setup, frequency, and cleaning;Milk Storage Reason for Pumping: SCN Pumping frequency: q 3 hours Pumped volume: 90 mL (or more)  Interventions Interventions: Breast feeding basics reviewed;Assisted with latch;Support pillows;Adjust position;Position options;Education  Discharge Pump: Personal  Consult Status Consult Status: PRN Date: 11/17/20 Follow-up type: Call as needed    Danford Bad 11/17/2020, 1:11 PM

## 2020-11-18 ENCOUNTER — Telehealth: Payer: Self-pay | Admitting: Obstetrics and Gynecology

## 2020-11-18 ENCOUNTER — Ambulatory Visit: Payer: Self-pay

## 2020-11-18 NOTE — Lactation Note (Addendum)
This note was copied from a baby's chart. Lactation Consultation Note  Patient Name: Rachel Mercer [id number] Date: 11/18/2020 Reason for consult: Follow-up assessment;NICU baby;Term Age:26 days  Maternal Data  Mom has not pumped breasts since approx 10:00am, they are sl firm, advised mom to pump at least every 3 hrs and if full before attempting feeding, pump to soften sl and to decrease flow at first let down   Feeding Mother's Current Feeding Choice: Breast Milk Nipple Type: Dr. Irving Burton Preemie Baby sleepy so taken out of swaddle, more fussy without swaddle and arches away from breast but able to latch to right breast in football hold with milk expressed into nipple shield, while taking care to position baby on pillows beside mom and not pushing baby to breast, baby nurses well with swallows but baby comes off breast after becomes overwhelmed with flow, needs to be burped and then will go back to breast, did this approx 4 times over 20 min. Reclined mom in chair to decrease rate of flow but by this time baby was full and was not interested in latching.   Mom pumps approx 6 oz each pumping session.  Baby's nurse remained at bedside to assist mom.       LATCH Score Latch: Grasps breast easily, tongue down, lips flanged, rhythmical sucking.  Audible Swallowing: Spontaneous and intermittent  Type of Nipple: Flat  Comfort (Breast/Nipple): Filling, red/small blisters or bruises, mild/mod discomfort (full breast)  Hold (Positioning): Assistance needed to correctly position infant at breast and maintain latch.  LATCH Score: 7   Lactation Tools Discussed/Used Tools: Nipple Shields Nipple shield size: 20  Interventions Interventions: Assisted with latch;Hand express;Support pillows;Adjust position Recommend pre-pumping to decrease let down flow, slight reclining while feeding,  Frequent burping.  Praised mom and encouraged her time to practice with what positions and techniques will  work well for her and baby.    Discharge Pump: DEBP;Personal  Consult Status Consult Status: Follow-up Date: 11/19/20 Follow-up type: In-patient    Dyann Kief 11/18/2020, 3:52 PM

## 2020-11-18 NOTE — Telephone Encounter (Signed)
New Message:   *STAT* If patient is at the pharmacy, call can be transferred to refill team.   1. Which medications need to be refilled? (please list name of each medication and dose if known) Venlafaxine and Clonazepam  2. Which pharmacy/location (including street and city if local pharmacy) is medication to be sent to? [facility] [place]   3. Do they need a 30 day or 90 day supply?  30 days

## 2020-11-19 ENCOUNTER — Ambulatory Visit: Payer: Self-pay

## 2020-11-19 NOTE — Lactation Note (Signed)
This note was copied from a baby's chart. Lactation Consultation Note  Patient Name: Rachel Mercer GGYIR'S Date: 11/19/2020 Reason for consult: Follow-up assessment;Mother's request;Difficult latch;Primapara;Term;Other (Comment) (Sleepy at this feeding) Age:26 days  When parents returned after lunch to breast feed, [name] was awake and rooting for the breast.  Assisted mom in comfortable position in football hold on left breast skin to skin using #20 nipple shield.  She latched after a couple of tries and began strong, rhythmic sucking for 2 to 3 minutes before starting to fall asleep and gagging at the breast.  Removed from the breast until choking episode resolved.  She refused to latch for more than a few seconds with tongue going to the roof of the mouth and jaw tightening.  Woke her up by switching positions to cradle hold, but as soon as we got her to the breast she fell asleep again.  Several attempts were made to wake her by both parents without success.  By this time, we had been trying for 20 to 25 minutes with very little sucking.  Breasts were filling and becoming uncomfortable.  Had mom pump using Symphony pump.  Mom expressed over 100 ml in short period which nurse fed 53 ml using slow flow nipple.  Encouraged mom to pre pump for a few minutes at next breast feed.  Encouraged mom to call lactation with any questions, concerns or assistance. Maternal Data Has patient been taught Hand Expression?: Yes Does the patient have breastfeeding experience prior to this delivery?: No (First baby)  Feeding Mother's Current Feeding Choice: Breast Milk Nipple Type: Slow - flow (switched due to infant collapsing DR Manson Passey niipple, working hard to suck)  LATCH Score Latch: Repeated attempts needed to sustain latch, nipple held in mouth throughout feeding, stimulation needed to elicit sucking reflex.  Audible Swallowing: A few with stimulation  Type of Nipple: Flat (Right nipple everted  more than left)  Comfort (Breast/Nipple): Filling, red/small blisters or bruises, mild/mod discomfort  Hold (Positioning): Assistance needed to correctly position infant at breast and maintain latch.  LATCH Score: 5   Lactation Tools Discussed/Used Tools: Pump;Nipple Shields Nipple shield size: 20 Breast pump type: Double-Electric Breast Pump Pump Education: Setup, frequency, and cleaning;Milk Storage;Other (comment) Reason for Pumping: Full breasts d/t [name] fallijng asleep at the breast Pumping frequency: every 3 to 4 hours for 15 minutes Pumped volume: 100 mL  Interventions Interventions: Breast feeding basics reviewed;Assisted with latch;Skin to skin;Breast massage;Hand express;Reverse pressure;Breast compression;Adjust position;Support pillows;Position options;Expressed milk;DEBP;Education  Discharge Discharge Education: Engorgement and breast care;Warning signs for feeding baby Pump: DEBP;Personal WIC Program: Yes  Consult Status Consult Status: PRN Date: 11/19/20 Follow-up type: Call as needed    Louis Meckel 11/19/2020, 4:58 PM

## 2020-11-20 ENCOUNTER — Ambulatory Visit: Payer: Self-pay

## 2020-11-20 NOTE — Lactation Note (Signed)
This note was copied from a baby's chart. Lactation Consultation Note  Patient Name: Rachel Mercer [id number] Date: 11/20/2020 Reason for consult: Follow-up assessment;Mother's request;Primapara;NICU baby;Term;Other (Comment) (Assisted with ast breast feed before discharge) Age:26 days  [name] is getting ready to be discharged home. Mom's nipples are slightly flattish with right breast more everted than left.  Mom wanting lactation to help with last breast feed.  [name] started out in football hold on right breast skin to skin.  She had no problems latching with #20 nipple shield.  In the football hold after a few minutes, she started coming on and off the breast maybe because she was pushing against the chair with her feet.  Once we switched to the cradle hold, she sustained the latch with strong, rhythmic sucking for 10 minutes with frequent audible swallows.  Mom was comfortable in the cradle hold with good pillow support.  Lactation discharge teaching completed discussing supply and demand, differences and treatment of full breasts, engorgement and mastitis, pumping, collection and storage of breast milk, frequency of breast feeds, how to know when [name] is getting enough, adequate intake and out put, growth spurts and community resources.  Lactation numbers given and encouraged to call with any questions, concerns or assistance. Maternal Data Has patient been taught Hand Expression?: Yes Does the patient have breastfeeding experience prior to this delivery?: No (First baby)  Feeding Mother's Current Feeding Choice: Breast Milk  LATCH Score                    Lactation Tools Discussed/Used Tools: Nipple Shields Nipple shield size: 20 Breast pump type: Double-Electric Breast Pump Pump Education: Setup, frequency, and cleaning;Milk Storage;Other (comment) Reason for Pumping: [name] was in SCN Pumping frequency: Pumps around every 3 hours unless breast feeding Pumped  volume:  (Approximately 3 to 4 oz)  Interventions    Discharge    Consult Status      Louis Meckel 11/20/2020, 4:09 PM

## 2020-11-23 ENCOUNTER — Encounter: Payer: Self-pay | Admitting: Surgical

## 2020-11-23 ENCOUNTER — Telehealth: Payer: Self-pay | Admitting: Obstetrics and Gynecology

## 2020-11-23 ENCOUNTER — Other Ambulatory Visit: Payer: Self-pay | Admitting: Obstetrics and Gynecology

## 2020-11-23 DIAGNOSIS — F410 Panic disorder [episodic paroxysmal anxiety] without agoraphobia: Secondary | ICD-10-CM

## 2020-11-23 MED ORDER — CLONAZEPAM 1 MG PO TABS: 1.0000 mg | ORAL_TABLET | Freq: Two times a day (BID) | ORAL | 0 refills | Status: DC | PRN

## 2020-11-23 NOTE — Telephone Encounter (Signed)
Rachel Mercer called in again today and is wanting to know when her prescription will be refilled.  Rachel Mercer states she really needs this medication.  The medication is Klonopin and she would like it sent to [facility] in [place].

## 2020-11-23 NOTE — Telephone Encounter (Signed)
Please advise 

## 2020-11-23 NOTE — Telephone Encounter (Signed)
LM for patient to return call. I will also send a copy of Dr. Logan Bores message through her my chart.

## 2020-11-24 ENCOUNTER — Encounter: Payer: Self-pay | Admitting: Surgical

## 2020-12-12 ENCOUNTER — Other Ambulatory Visit: Payer: Self-pay

## 2020-12-12 ENCOUNTER — Ambulatory Visit (INDEPENDENT_AMBULATORY_CARE_PROVIDER_SITE_OTHER): Payer: Medicaid Other | Admitting: Obstetrics and Gynecology

## 2020-12-12 ENCOUNTER — Encounter: Payer: Self-pay | Admitting: Obstetrics and Gynecology

## 2020-12-12 DIAGNOSIS — Z3009 Encounter for other general counseling and advice on contraception: Secondary | ICD-10-CM
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
## 2020-12-13 ENCOUNTER — Other Ambulatory Visit: Payer: Self-pay | Admitting: Family Medicine

## 2020-12-13 DIAGNOSIS — F32A Depression, unspecified: Secondary | ICD-10-CM

## 2020-12-13 DIAGNOSIS — F419 Anxiety disorder, unspecified: Secondary | ICD-10-CM

## 2020-12-22 ENCOUNTER — Other Ambulatory Visit: Payer: Self-pay

## 2020-12-22 ENCOUNTER — Encounter: Payer: Self-pay | Admitting: Obstetrics and Gynecology

## 2020-12-22 ENCOUNTER — Ambulatory Visit (INDEPENDENT_AMBULATORY_CARE_PROVIDER_SITE_OTHER): Payer: Medicaid Other | Admitting: Obstetrics and Gynecology

## 2020-12-22 VITALS — BP 91/70 | HR 102 | Ht 65.0 in | Wt 156.8 lb

## 2020-12-22 DIAGNOSIS — Z30430 Encounter for insertion of intrauterine contraceptive device: Secondary | ICD-10-CM | POA: Diagnosis not present

## 2020-12-22 LAB — POCT URINE PREGNANCY: Preg Test, Ur: NEGATIVE
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
## 2021-01-19 ENCOUNTER — Encounter: Payer: Medicaid Other | Admitting: Obstetrics and Gynecology

## 2021-01-31 ENCOUNTER — Encounter: Payer: Medicaid Other | Admitting: Obstetrics and Gynecology

## 2021-02-09 ENCOUNTER — Encounter: Payer: Self-pay | Admitting: Obstetrics and Gynecology

## 2021-04-11 ENCOUNTER — Ambulatory Visit: Payer: Medicaid Other | Admitting: Family Medicine

## 2021-04-17 DIAGNOSIS — F339 Major depressive disorder, recurrent, unspecified: Secondary | ICD-10-CM | POA: Diagnosis not present

## 2021-04-17 DIAGNOSIS — F4310 Post-traumatic stress disorder, unspecified: Secondary | ICD-10-CM | POA: Diagnosis not present

## 2021-04-17 DIAGNOSIS — F411 Generalized anxiety disorder: Secondary | ICD-10-CM | POA: Diagnosis not present

## 2021-04-17 DIAGNOSIS — F410 Panic disorder [episodic paroxysmal anxiety] without agoraphobia: Secondary | ICD-10-CM | POA: Diagnosis not present

## 2021-05-24 ENCOUNTER — Other Ambulatory Visit: Payer: Self-pay

## 2021-05-24 ENCOUNTER — Encounter: Payer: Self-pay | Admitting: Family Medicine

## 2021-05-24 ENCOUNTER — Ambulatory Visit (INDEPENDENT_AMBULATORY_CARE_PROVIDER_SITE_OTHER): Payer: Medicaid Other | Admitting: Family Medicine

## 2021-05-24 VITALS — BP 98/58 | HR 100 | Temp 97.6°F | Resp 16 | Ht 65.0 in | Wt 152.0 lb

## 2021-05-24 DIAGNOSIS — F419 Anxiety disorder, unspecified: Secondary | ICD-10-CM

## 2021-05-24 DIAGNOSIS — G8929 Other chronic pain: Secondary | ICD-10-CM | POA: Diagnosis not present

## 2021-05-24 DIAGNOSIS — F32A Depression, unspecified: Secondary | ICD-10-CM

## 2021-05-24 DIAGNOSIS — M546 Pain in thoracic spine: Secondary | ICD-10-CM

## 2021-05-24 NOTE — Patient Instructions (Signed)
TRY TYLENOL 2 TABLETS 2-3 TIMES DAILY FOR BACK PAIN. ALSO USE HEATING PAD FOR BACK PAIN.

## 2021-05-24 NOTE — Progress Notes (Signed)
I,Rachel Mercer,acting as a scribe for Rachel Mans, MD.,have documented all relevant documentation on the behalf of Rachel Mans, MD,as directed by  Rachel Mans, MD while in the presence of Rachel Mans, MD.   Established patient visit   Patient: Rachel Mercer   DOB: 1996/04/03   25 y.o. Female  MRN: [id number] Visit Date: 05/24/2021  Today's healthcare provider: [name], MD   Chief Complaint  Patient presents with   Back Pain   Subjective    Back Pain This is a new problem. The current episode started more than 1 month ago (3 months). The problem has been gradually worsening since onset. The pain is present in the lumbar spine. The quality of the pain is described as stabbing and aching. The pain does not radiate. The pain is at a severity of 4/10. The pain is mild. The pain is Worse during the day. The symptoms are aggravated by sitting and standing. Associated symptoms include numbness and tingling. Pertinent negatives include no abdominal pain, bladder incontinence, bowel incontinence, chest pain, dysuria, fever, headaches, leg pain, paresis, paresthesias, pelvic pain, perianal numbness, weakness or weight loss. She has tried nothing for the symptoms.   Patient has a 43-month-old daughter at home.  Has been breast-feeding and is breast-feeding via bottle recently.  He has no chest pain or shortness of breath and no rash. Thoracic back strain pain she is experiencing seems to be aggravated by certain movements. It does not radiate to the front.  It seems to feel more on the left side than the right.  Patient states she has had mid to low back pain for around 3 months. Patient sates pain is mild with numbness in her big toe, on right foot. Pain is worse when standing. She does not treat symptoms at home.    Medications: Outpatient Medications Prior to Visit  Medication Sig   calcium carbonate (TUMS EX) 750 MG chewable tablet Chew 1 tablet  by mouth daily.   clonazePAM (KLONOPIN) 0.5 MG tablet Take by mouth.   gabapentin (NEURONTIN) 100 MG capsule Take 100 mg by mouth 3 (three) times daily.   venlafaxine XR (EFFEXOR XR) 75 MG 24 hr capsule Take one pill daily along with 150 mg tablet to total 225 mg daily.   venlafaxine XR (EFFEXOR-XR) 150 MG 24 hr capsule TAKE 1 CAPSULE BY MOUTH ONCE DAILY ALONG WITH 75MG  TO TOTAL 225MG  DAILY   [DISCONTINUED] clonazePAM (KLONOPIN) 1 MG tablet Take 1 tablet (1 mg total) by mouth 2 (two) times daily as needed for anxiety. (Patient not taking: Reported on 05/24/2021)   [DISCONTINUED] Prenatal Vit-Fe Fumarate-FA (PRENATAL VITAMINS PO) Take by mouth. (Patient not taking: Reported on 12/22/2020)   No facility-administered medications prior to visit.    Review of Systems  Constitutional:  Negative for appetite change, chills, fatigue, fever and weight loss.  Respiratory:  Negative for chest tightness and shortness of breath.   Cardiovascular:  Negative for chest pain and palpitations.  Gastrointestinal:  Negative for abdominal pain, bowel incontinence, nausea and vomiting.  Genitourinary:  Negative for bladder incontinence, dysuria and pelvic pain.  Musculoskeletal:  Positive for back pain.  Neurological:  Positive for tingling and numbness. Negative for dizziness, weakness, headaches and paresthesias.      Objective    BP (!) 98/58 (BP Location: Left Arm, Patient Position: Sitting, Cuff Size: Normal)   Pulse 100   Temp 97.6 F (36.4 C) (Temporal)   Resp 16  Ht 5\' 5"  (1.651 m)   Wt 152 lb (68.9 kg)   SpO2 99%   Breastfeeding Yes   BMI 25.29 kg/m  {Show previous vital signs (optional):23777}  Physical Exam Vitals reviewed.  Constitutional:      General: She is not in acute distress.    Appearance: She is well-developed.  HENT:     Head: Normocephalic and atraumatic.     Right Ear: Hearing normal.     Left Ear: Hearing normal.     Nose: Nose normal.  Eyes:     General: Lids are  normal. No scleral icterus.       Right eye: No discharge.        Left eye: No discharge.     Conjunctiva/sclera: Conjunctivae normal.  Cardiovascular:     Rate and Rhythm: Normal rate and regular rhythm.     Heart sounds: Normal heart sounds.  Pulmonary:     Effort: Pulmonary effort is normal. No respiratory distress.  Musculoskeletal:        General: No tenderness, deformity or signs of injury. Normal range of motion.     Cervical back: Normal range of motion and neck supple.  Skin:    General: Skin is warm and dry.     Findings: No lesion or rash.  Neurological:     General: No focal deficit present.     Mental Status: She is alert and oriented to person, place, and time.     Comments: Examination of the back and the right great toe are normal  Psychiatric:        Mood and Affect: Mood normal.        Speech: Speech normal.        Behavior: Behavior normal.        Thought Content: Thought content normal.        Judgment: Judgment normal.      No results found for any visits on 05/24/21.  Assessment & Plan     1. Chronic left-sided thoracic back pain Think this is all musculoskeletal with a 51-month-old daughter at home. Use Tylenol and heating pad for the time being.  No further work-up at this time.  2. Anxiety and depression Controlled on Effexor and as needed clonazepam   No follow-ups on file.      I, 8-month, MD, have reviewed all documentation for this visit. The documentation on 05/28/21 for the exam, diagnosis, procedures, and orders are all accurate and complete.    Lynnix Schoneman 05/30/21, MD  [facility] [phone] (phone) [phone] (fax)  [facility]

## 2021-05-30 ENCOUNTER — Telehealth: Payer: Self-pay

## 2021-05-30 ENCOUNTER — Other Ambulatory Visit: Payer: Self-pay

## 2021-05-30 DIAGNOSIS — F32A Depression, unspecified: Secondary | ICD-10-CM

## 2021-05-30 NOTE — Telephone Encounter (Signed)
Copied from CRM [id number]. Topic: Referral - Request for Referral >> May 30, 2021 10:33 AM Wyonia Hough E wrote: Has patient seen PCP for this complaint? Yes (Dr. Sullivan Lone)  *If NO, is insurance requiring patient see PCP for this issue before PCP can refer them? Referral for which specialty: Behavorial health  Preferred provider/office: anywhere that accepts her insurance  Reason for referral: For anxiety and depression

## 2021-05-30 NOTE — Telephone Encounter (Signed)
ok 

## 2021-06-01 ENCOUNTER — Telehealth: Payer: Self-pay

## 2021-06-01 NOTE — Telephone Encounter (Signed)
Copied from CRM [id number]. Topic: Appointment Scheduling - Scheduling Inquiry for Clinic >> Jun 01, 2021  4:02 PM Gaetana Michaelis A wrote: Reason for CRM: The patient would like to be seen by Dr. Sullivan Lone or Jabier Mutton. Suzie Portela to discuss their continued back discomfort   Please contact further

## 2021-06-01 NOTE — Telephone Encounter (Signed)
Copied from CRM [id number]. Topic: General - Other >> Jun 01, 2021  3:59 PM Gaetana Michaelis A wrote: Reason for CRM: The patient was previously directed by Dr. Sullivan Lone to take Three Tylenol daily for their back pain and told that if it continued to call back   The patient previously discussed being prescribed a muscle relaxer to help with their back pain on 05/24/21  The patient would like to be prescribed the medication previously discussed with Dr. Sullivan Lone   Please contact further when possible

## 2021-06-02 ENCOUNTER — Other Ambulatory Visit: Payer: Self-pay | Admitting: Family Medicine

## 2021-06-02 ENCOUNTER — Ambulatory Visit: Payer: Self-pay

## 2021-06-02 MED ORDER — METAXALONE 800 MG PO TABS: 800.0000 mg | ORAL_TABLET | Freq: Three times a day (TID) | ORAL | 0 refills | Status: DC | PRN

## 2021-06-02 MED ORDER — BACLOFEN 20 MG PO TABS: 20.0000 mg | ORAL_TABLET | Freq: Three times a day (TID) | ORAL | 0 refills | Status: DC

## 2021-06-02 NOTE — Telephone Encounter (Signed)
Spoke with patient. She states that she is not trying to get another appointment. She states that she would like the Muscle relaxer called in per appointment with Dr Sullivan Lone on 05/24/21. Please advise.

## 2021-06-02 NOTE — Telephone Encounter (Signed)
Please review patient response below. KW 

## 2021-06-02 NOTE — Telephone Encounter (Signed)
Pt called and stated that she is having back pain for 1mo. Patient would like clinical advice.  Pt. Reports she saw Dr. Sullivan Lone for mid-low back pain. Has tried Tylenol without relief. Asking if she can be prescribed a muscle relaxer she can take during the day and not make her drowsy. Please advise.    Answer Assessment - Initial Assessment Questions 1. ONSET: "When did the pain begin?"      3 months ago 2. LOCATION: "Where does it hurt?" (upper, mid or lower back)     Mid-low back 3. SEVERITY: "How bad is the pain?"  (e.g., Scale 1-10; mild, moderate, or severe)   - MILD (1-3): doesn't interfere with normal activities    - MODERATE (4-7): interferes with normal activities or awakens from sleep    - SEVERE (8-10): excruciating pain, unable to do any normal activities      Mild- moderate 4. PATTERN: "Is the pain constant?" (e.g., yes, no; constant, intermittent)      Comes and goes 5. RADIATION: "Does the pain shoot into your legs or elsewhere?"     No 6. CAUSE:  "What do you think is causing the back pain?"      Unsure 7. BACK OVERUSE:  "Any recent lifting of heavy objects, strenuous work or exercise?"     Tylenol 8. MEDICATIONS: "What have you taken so far for the pain?" (e.g., nothing, acetaminophen, NSAIDS)     Tylenol 9. NEUROLOGIC SYMPTOMS: "Do you have any weakness, numbness, or problems with bowel/bladder control?"     No 10. OTHER SYMPTOMS: "Do you have any other symptoms?" (e.g., fever, abdominal pain, burning with urination, blood in urine)       No 11. PREGNANCY: "Is there any chance you are pregnant?" (e.g., yes, no; LMP)       No  Protocols used: Back Pain-A-AH

## 2021-06-02 NOTE — Telephone Encounter (Signed)
Please see previous encounter with medication requested. KW

## 2021-06-26 ENCOUNTER — Telehealth: Payer: Self-pay | Admitting: Family Medicine

## 2021-06-26 NOTE — Telephone Encounter (Signed)
Patient was seen for back pain and was given a muscle relaxer. She stated her back is still hurting really bad in the spinal area. She needed to know if she needs another appointment to be seen or is there something else she can take for the pain. Would like a call back ASAP please.

## 2021-06-26 NOTE — Telephone Encounter (Signed)
Please advise 

## 2021-06-27 NOTE — Telephone Encounter (Signed)
Pt was calling back to check on her earlier message regarding her back pain, please advise.

## 2021-06-27 NOTE — Telephone Encounter (Signed)
Pt advised.  She is going to go to Urgent Care.   Thanks,   -[name]

## 2021-06-28 ENCOUNTER — Telehealth: Payer: Self-pay

## 2021-06-28 ENCOUNTER — Ambulatory Visit: Payer: Self-pay

## 2021-06-28 DIAGNOSIS — F419 Anxiety disorder, unspecified: Secondary | ICD-10-CM

## 2021-06-28 NOTE — Telephone Encounter (Signed)
Copied from CRM [id number]. Topic: Referral - Request for Referral >> Jun 28, 2021  3:57 PM Wyonia Hough E wrote: Has patient seen PCP for this complaint? Yes (Dr. Sherrie Mustache and Maurine Minister)  *If NO, is insurance requiring patient see PCP for this issue before PCP can refer them? Referral for which specialty: psychiatry  Preferred provider/office: [facility] / Fax# [phone] Reason for referral: worsening anxiety  Pt has a psychiatrist but her provider has restrictions with meds due to where she practices / so pt needs a referral to a new location

## 2021-06-28 NOTE — Telephone Encounter (Signed)
Pt calling in to get referral for new psychiatrist office. The one she seeing now is an online office and doesn't prescribe a higher dose of medication that she is needing. Pt was on Klonopin 1mg  previously before moving to [place] and now is only receiving 0.5mg  which is effecting her anxiety. Pt denied needing any help from a nursing stand point. Advised her to call back Monday to check on referral and see if appt has been made. Pt verbalized understanding.    Reason for Disposition  [1] Caller requesting NON-URGENT health information AND [2] PCP's office is the best resource  Answer Assessment - Initial Assessment Questions 1. REASON FOR CALL or QUESTION: "What is your reason for calling today?" or "How can I best help you?" or "What question do you have that I can help answer?"     Pt is calling only to get referral in place for new psychiatrist.  Protocols used: Information Only Call - No Triage-A-AH

## 2021-06-29 ENCOUNTER — Other Ambulatory Visit: Payer: Self-pay | Admitting: Family Medicine

## 2021-06-29 DIAGNOSIS — F32A Depression, unspecified: Secondary | ICD-10-CM

## 2021-06-29 DIAGNOSIS — F419 Anxiety disorder, unspecified: Secondary | ICD-10-CM

## 2021-07-03 ENCOUNTER — Other Ambulatory Visit: Payer: Self-pay | Admitting: Family Medicine

## 2021-07-03 NOTE — Telephone Encounter (Signed)
Medication Refill - Medication: clonazePAM (KLONOPIN) 0.5 MG tablet  Has the patient contacted their pharmacy? No. (Agent: If no, request that the patient contact the pharmacy for the refill. If patient does not wish to contact the pharmacy document the reason why and proceed with request.) Patient has no refill left.  Preferred Pharmacy (with phone number or street name):  [id number] [place], [place] - [address]  Phone:  [phone] Fax:  [phone]  Has the patient been seen for an appointment in the last year OR does the patient have an upcoming appointment? Yes.    Agent: Please be advised that RX refills may take up to 3 business days. We ask that you follow-up with your pharmacy.

## 2021-07-03 NOTE — Telephone Encounter (Signed)
Requested medications are due for refill today.  unsure  Requested medications are on the active medications list.  yes  Last refill. 04/25/01  Future visit scheduled.   no  Notes to clinic.  Listed as Teacher, early years/pre. Medication not delegated.

## 2021-07-03 NOTE — Telephone Encounter (Signed)
Patient calling and is requesting an update on this referral. Patient has been requesting this since 11/1 and hasn't received a response back, Patient is requesting a call. Please advise.

## 2021-07-04 NOTE — Telephone Encounter (Signed)
Patient called in to inform Merita Norton that she will not make it to see the new Psychiatrist before running our of medication. Please call patient Ph# [phone]

## 2021-07-04 NOTE — Telephone Encounter (Signed)
Referral was placed 06/29/2021 by Merita Norton, I contacted patient and notified her. KW

## 2021-07-05 NOTE — Telephone Encounter (Signed)
Attempted to reach patient, okay for [facility] nurse triage to advise patient of providers response as below. KW

## 2021-07-05 NOTE — Telephone Encounter (Signed)
Pt called to check on status of med refill for ClonazePAM/ please advise

## 2021-07-05 NOTE — Telephone Encounter (Signed)
Got a hold of patient and advised as below. She states that this will not do because she is switching psychiatrist, she states that the psychiatrist told her that she no longer prescribes controlled substances. Patient states that her psychiatrist was trying to cut her back on medication and patient states that she has been on this since she was 25yrs old. Patient stating that she is in the process of finding a new psychiatrist  that will continue her medication at the same strength she has been on all these years.Patient states that referral team has tried reaching out to her to help schedule appt for psychiatry and patient is requesting that you send in prescription today because she is out completley. KW

## 2021-07-05 NOTE — Telephone Encounter (Signed)
Spoke to pt she stated that she is taking 2 mg (4 tablets) daily and she is running out sooner. Pt stated that she had been on 2 mg since she was 17 her prescription is for 0.5 mg. Pt does want the medication to be refilled.  KP

## 2021-07-05 NOTE — Telephone Encounter (Signed)
Pt has called back and states she is needing this med and has been off med after years of taking. FU [phone]

## 2021-07-06 ENCOUNTER — Ambulatory Visit: Payer: Self-pay

## 2021-07-06 ENCOUNTER — Telehealth: Payer: Self-pay

## 2021-07-06 ENCOUNTER — Other Ambulatory Visit: Payer: Self-pay | Admitting: Family Medicine

## 2021-07-06 ENCOUNTER — Telehealth: Payer: Self-pay | Admitting: Family Medicine

## 2021-07-06 ENCOUNTER — Encounter: Payer: Self-pay | Admitting: Family Medicine

## 2021-07-06 MED ORDER — CLONAZEPAM 0.5 MG PO TABS: 0.5000 mg | ORAL_TABLET | ORAL | 0 refills | Status: DC

## 2021-07-06 NOTE — Telephone Encounter (Signed)
Patient called wanting an update on her prescription. Please advise.

## 2021-07-06 NOTE — Telephone Encounter (Signed)
Copied from CRM [id number]. Topic: General - Other >> Jul 06, 2021 10:56 AM Marylen Ponto wrote: Reason for CRM: Pt requests call back with update on Rx refill request for ClonazePAM. Cb# [phone]

## 2021-07-06 NOTE — Telephone Encounter (Signed)
Copied from CRM [id number]. Topic: General - Other >> Jul 06, 2021  1:05 PM Rachel Mercer wrote: Reason for CRM: The patient has been in touch with their pharmacy and told that Prov. Suzie Portela must speak with Mercer member of staff directly for them to release the patient's prescription for clonazePAM (KLONOPIN) 0.5 MG tablet [[id number]]   The patient's pharmacy [id number] - [place], [place] - [address] [place] [address] Phone: [phone] Fax: [phone]   Would like to be contacted when possible  The patient has stressed the urgency of their request

## 2021-07-06 NOTE — Telephone Encounter (Signed)
Patient called back stated that she have spoken to someone at the pharmacy and that they are not able to fill her Rx for clonazePAM (KLONOPIN) 0.5 MG tablet. Per patient they have contacted the provider and she would like a call back p [phone]

## 2021-07-06 NOTE — Telephone Encounter (Signed)
Deputy Rachel Mercer called in on standby at Dr Rachel Mercer request for patient with anxiety. He says she is ok and she is with family
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
## 2021-07-07 NOTE — Telephone Encounter (Signed)
Please see previous telephone encounter from day 07/06/21, patient and pharmacist has been contacted in regards to medication. KW

## 2021-07-10 ENCOUNTER — Other Ambulatory Visit: Payer: Self-pay | Admitting: Family Medicine

## 2021-09-20 DIAGNOSIS — F411 Generalized anxiety disorder: Secondary | ICD-10-CM | POA: Diagnosis not present

## 2021-09-20 DIAGNOSIS — F339 Major depressive disorder, recurrent, unspecified: Secondary | ICD-10-CM | POA: Diagnosis not present

## 2021-10-16 ENCOUNTER — Telehealth: Payer: Self-pay | Admitting: Family Medicine

## 2021-10-16 NOTE — Telephone Encounter (Signed)
Patient called, left VM to return the call to clarify the medication needed for refill.  ?

## 2021-10-16 NOTE — Telephone Encounter (Signed)
Please advise 

## 2021-10-16 NOTE — Telephone Encounter (Signed)
Pt would like Klonopin 0.5 mg #8. That amount will allow her to get her her appointment with her new psychiatrist. ?

## 2021-10-16 NOTE — Telephone Encounter (Signed)
Pt Called and stated that she would like to Know if Dr Sullivan Lone will call 8 .5mg  called in. Pt states that she has a Psychiatrist appointment on 3/25. She states that she is needing enough until appointment. Please advise.  ? ?[id number] [place] - [address] Phone:  [phone]  ?Fax:  [phone]  ?  ? ?

## 2021-10-17 ENCOUNTER — Other Ambulatory Visit: Payer: Self-pay

## 2021-10-17 ENCOUNTER — Telehealth: Payer: Self-pay

## 2021-10-17 MED ORDER — CLONAZEPAM 0.5 MG PO TABS: 0.5000 mg | ORAL_TABLET | Freq: Two times a day (BID) | ORAL | 0 refills | Status: DC | PRN

## 2021-10-17 NOTE — Telephone Encounter (Signed)
Pt called back waiting for response. Please advise. ?

## 2021-10-17 NOTE — Telephone Encounter (Signed)
Copied from [place] [id number]. Topic: General - Other ?>> Oct 17, 2021  9:11 AM Tessa Lerner A wrote: ?Reason for CRM: The patient has called for an update on their previously requested medication refill ? ?The patient has previously requested Klonopin 0.5 mg #8 ? ?Please contact further when possible ?

## 2021-10-23 DIAGNOSIS — F410 Panic disorder [episodic paroxysmal anxiety] without agoraphobia: Secondary | ICD-10-CM | POA: Diagnosis not present

## 2021-10-23 DIAGNOSIS — Z79899 Other long term (current) drug therapy: Secondary | ICD-10-CM | POA: Diagnosis not present

## 2021-10-23 DIAGNOSIS — F332 Major depressive disorder, recurrent severe without psychotic features: Secondary | ICD-10-CM | POA: Diagnosis not present

## 2021-10-23 DIAGNOSIS — F411 Generalized anxiety disorder: Secondary | ICD-10-CM | POA: Diagnosis not present

## 2021-10-23 DIAGNOSIS — Z5181 Encounter for therapeutic drug level monitoring: Secondary | ICD-10-CM | POA: Diagnosis not present

## 2021-11-13 ENCOUNTER — Other Ambulatory Visit: Payer: Self-pay | Admitting: Family Medicine

## 2021-11-13 DIAGNOSIS — F419 Anxiety disorder, unspecified: Secondary | ICD-10-CM

## 2021-11-13 DIAGNOSIS — F32A Depression, unspecified: Secondary | ICD-10-CM

## 2021-11-13 NOTE — Telephone Encounter (Signed)
Medication Refill - Medication: venlafaxine XR (EFFEXOR-XR) 150 MG 24 hr capsule, venlafaxine XR (EFFEXOR XR) 75 MG 24 hr capsule  ? ?Has the patient contacted their pharmacy? Yes.   ? ?(Agent: If yes, when and what did the pharmacy advise?) Need new Rx  ? ?Preferred Pharmacy (with phone number or street name):  ?[id number] [address], [place] Phone:  [phone]  ?Fax:  [phone]  ?  ? ?Has the patient been seen for an appointment in the last year OR does the patient have an upcoming appointment? Yes ? ?Agent: Please be advised that RX refills may take up to 3 business days. We ask that you follow-up with your pharmacy. ? ?

## 2021-11-14 MED ORDER — VENLAFAXINE HCL ER 150 MG PO CP24: ORAL_CAPSULE | ORAL | 0 refills | Status: DC

## 2021-11-14 MED ORDER — VENLAFAXINE HCL ER 75 MG PO CP24: ORAL_CAPSULE | ORAL | 1 refills | Status: DC

## 2021-11-14 NOTE — Telephone Encounter (Signed)
Requested medications are due for refill today.  unsure ? ?Requested medications are on the active medications list.  yes ? ?Last refill. 75 mg 10/14/2020 #30 1 refill, 150 mg 12/13/2020 #30 0 refills ? ?Future visit scheduled.   no ? ?Notes to clinic.  Labs are expired. Pt kast seen 05/24/2021. ? ? ? ?Requested Prescriptions  ?Pending Prescriptions Disp Refills  ? venlafaxine XR (EFFEXOR-XR) 150 MG 24 hr capsule 30 capsule 0  ?  Sig: TAKE 1 CAPSULE BY MOUTH ONCE DAILY ALONG WITH 75MG  TO TOTAL 225MG  DAILY  ?  ? Psychiatry: Antidepressants - SNRI - desvenlafaxine & venlafaxine Failed - 11/13/2021  2:41 PM  ?  ?  Failed - Cr in normal range and within 360 days  ?  Creatinine  ?Date Value Ref Range Status  ?05/08/2013 0.73 0.60 - 1.30 mg/dL Final  ? ?Creatinine, Ser  ?Date Value Ref Range Status  ?10/23/2016 0.83 0.57 - 1.00 mg/dL Final  ?  ?  ?  ?  Failed - Completed PHQ-2 or PHQ-9 in the last 360 days  ?  ?  Failed - Lipid Panel in normal range within the last 12 months  ?  No results found for: CHOL, POCCHOL, CHOLTOT ?No results found for: LDLCALC, LDLC, HIRISKLDL, POCLDL, LDLDIRECT, REALLDLC, TOTLDLC ?No results found for: HDL, POCHDL ?No results found for: TRIG, POCTRIG ?  ?  ?  Passed - Last BP in normal range  ?  BP Readings from Last 1 Encounters:  ?05/24/21 (!) 98/58  ?  ?  ?  ?  Passed - Valid encounter within last 6 months  ?  Recent Outpatient Visits   ? ?      ? 5 months ago Chronic left-sided thoracic back pain  ? [facility] 05/26/21., [place]  ? 1 year ago Depression, unspecified depression type  ? [facility] [place], [place], [place]  ? 3 years ago Possible pregnancy  ? [facility] [place], [place], [place]  ? 4 years ago Acute midline low back pain without sciatica  ? [facility] [place], [place], [place]  ? 4 years ago Depression, unspecified depression type  ? [facility] [place], [place], PA-C  ? ?  ?  ? ? ?  ?  ?  ?  venlafaxine XR (EFFEXOR XR) 75 MG 24 hr capsule 30 capsule 1  ?  Sig: Take one pill daily along with 150 mg tablet to total 225 mg daily.  ?  ? Psychiatry: Antidepressants - SNRI - desvenlafaxine & venlafaxine Failed - 11/13/2021  2:41 PM  ?  ?  Failed - Cr in normal range and within 360 days  ?  Creatinine  ?Date Value Ref Range Status  ?05/08/2013 0.73 0.60 - 1.30 mg/dL Final  ? ?Creatinine, Ser  ?Date Value Ref Range Status  ?10/23/2016 0.83 0.57 - 1.00 mg/dL Final  ?  ?  ?  ?  Failed - Completed PHQ-2 or PHQ-9 in the last 360 days  ?  ?  Failed - Lipid Panel in normal range within the last 12 months  ?  No results found for: CHOL, POCCHOL, CHOLTOT ?No results found for: LDLCALC, LDLC, HIRISKLDL, POCLDL, LDLDIRECT, REALLDLC, TOTLDLC ?No results found for: HDL, POCHDL ?No results found for: TRIG, POCTRIG ?  ?  ?  Passed - Last BP in normal range  ?  BP Readings from Last 1 Encounters:  ?05/24/21 (!) 98/58  ?  ?  ?  ?  Passed - Valid encounter within last 6 months  ?  Recent Outpatient Visits   ? ?      ? 5 months ago Chronic left-sided thoracic back pain  ? [facility] [place]., [place]  ? 1 year ago Depression, unspecified depression type  ? [facility] [place], [place], [place]  ? 3 years ago Possible pregnancy  ? [facility] [place], [place], [place]  ? 4 years ago Acute midline low back pain without sciatica  ? [facility] [place], [place], [place]  ? 4 years ago Depression, unspecified depression type  ? [facility] [place], Jodell [place], PA-C  ? ?  ?  ? ? ?  ?  ?  ?  ?

## 2021-11-20 DIAGNOSIS — F410 Panic disorder [episodic paroxysmal anxiety] without agoraphobia: Secondary | ICD-10-CM | POA: Diagnosis not present

## 2021-11-20 DIAGNOSIS — Z79899 Other long term (current) drug therapy: Secondary | ICD-10-CM | POA: Diagnosis not present

## 2021-11-20 DIAGNOSIS — F332 Major depressive disorder, recurrent severe without psychotic features: Secondary | ICD-10-CM | POA: Diagnosis not present

## 2021-11-20 DIAGNOSIS — F411 Generalized anxiety disorder: Secondary | ICD-10-CM | POA: Diagnosis not present

## 2021-12-18 DIAGNOSIS — F411 Generalized anxiety disorder: Secondary | ICD-10-CM | POA: Diagnosis not present

## 2021-12-18 DIAGNOSIS — F332 Major depressive disorder, recurrent severe without psychotic features: Secondary | ICD-10-CM | POA: Diagnosis not present

## 2021-12-18 DIAGNOSIS — F410 Panic disorder [episodic paroxysmal anxiety] without agoraphobia: Secondary | ICD-10-CM | POA: Diagnosis not present

## 2022-01-23 DIAGNOSIS — F411 Generalized anxiety disorder: Secondary | ICD-10-CM | POA: Diagnosis not present

## 2022-01-23 DIAGNOSIS — F410 Panic disorder [episodic paroxysmal anxiety] without agoraphobia: Secondary | ICD-10-CM | POA: Diagnosis not present

## 2022-01-23 DIAGNOSIS — F332 Major depressive disorder, recurrent severe without psychotic features: Secondary | ICD-10-CM | POA: Diagnosis not present

## 2022-02-15 ENCOUNTER — Telehealth: Payer: Self-pay | Admitting: General Practice

## 2022-02-15 NOTE — Telephone Encounter (Signed)
Called patient to inform her about [facility] per referral; she is interested and will be looking at the application online

## 2022-02-23 DIAGNOSIS — Z5181 Encounter for therapeutic drug level monitoring: Secondary | ICD-10-CM | POA: Diagnosis not present

## 2022-02-23 DIAGNOSIS — Z79899 Other long term (current) drug therapy: Secondary | ICD-10-CM | POA: Diagnosis not present

## 2022-02-26 DIAGNOSIS — F410 Panic disorder [episodic paroxysmal anxiety] without agoraphobia: Secondary | ICD-10-CM | POA: Diagnosis not present

## 2022-02-26 DIAGNOSIS — F332 Major depressive disorder, recurrent severe without psychotic features: Secondary | ICD-10-CM | POA: Diagnosis not present

## 2022-02-26 DIAGNOSIS — F411 Generalized anxiety disorder: Secondary | ICD-10-CM | POA: Diagnosis not present

## 2022-03-22 DIAGNOSIS — F410 Panic disorder [episodic paroxysmal anxiety] without agoraphobia: Secondary | ICD-10-CM | POA: Diagnosis not present

## 2022-03-22 DIAGNOSIS — F411 Generalized anxiety disorder: Secondary | ICD-10-CM | POA: Diagnosis not present

## 2022-03-22 DIAGNOSIS — F332 Major depressive disorder, recurrent severe without psychotic features: Secondary | ICD-10-CM | POA: Diagnosis not present

## 2022-04-05 DIAGNOSIS — F900 Attention-deficit hyperactivity disorder, predominantly inattentive type: Secondary | ICD-10-CM | POA: Diagnosis not present

## 2022-04-06 DIAGNOSIS — Z79899 Other long term (current) drug therapy: Secondary | ICD-10-CM | POA: Diagnosis not present

## 2022-05-08 ENCOUNTER — Encounter: Payer: Self-pay | Admitting: Obstetrics and Gynecology

## 2022-05-14 DIAGNOSIS — F410 Panic disorder [episodic paroxysmal anxiety] without agoraphobia: Secondary | ICD-10-CM | POA: Diagnosis not present

## 2022-05-14 DIAGNOSIS — F332 Major depressive disorder, recurrent severe without psychotic features: Secondary | ICD-10-CM | POA: Diagnosis not present

## 2022-05-14 DIAGNOSIS — F411 Generalized anxiety disorder: Secondary | ICD-10-CM | POA: Diagnosis not present

## 2022-10-15 ENCOUNTER — Telehealth: Payer: Self-pay | Admitting: Family Medicine

## 2022-10-15 ENCOUNTER — Other Ambulatory Visit: Payer: Self-pay | Admitting: Family Medicine

## 2022-10-15 DIAGNOSIS — F32A Depression, unspecified: Secondary | ICD-10-CM

## 2022-10-15 DIAGNOSIS — F419 Anxiety disorder, unspecified: Secondary | ICD-10-CM

## 2022-10-15 MED ORDER — VENLAFAXINE HCL ER 75 MG PO CP24: 225.0000 mg | ORAL_CAPSULE | Freq: Every day | ORAL | 0 refills | Status: DC

## 2022-10-15 NOTE — Telephone Encounter (Signed)
Pt hasn't been able to get thru to her Psychiatrist to get refill for  venlafaxine XR (EFFEXOR-XR) 150 MG 24 hr capsule  / pt would like to know if Daneil Dan will refill this again for her / please advise / pt stated this is urgent and she is out of medication

## 2022-10-16 NOTE — Telephone Encounter (Signed)
Patient advised. Scheduled for 10/22/22.

## 2022-10-18 NOTE — Progress Notes (Deleted)
     I,J'ya E Timeka Goette,acting as a scribe for Gwyneth Sprout, FNP.,have documented all relevant documentation on the behalf of Gwyneth Sprout, FNP,as directed by  Gwyneth Sprout, FNP while in the presence of Gwyneth Sprout, FNP.   Established patient visit   Patient: Rachel Mercer   DOB: [birth year]   27 y.o. Female  MRN: [id number] Visit Date: 10/22/2022  Today's healthcare provider: Gwyneth Sprout, FNP   No chief complaint on file.  Subjective    HPI  Depression and Anxiety, Follow-up  She  was last seen for this 1 years ago. Changes made at last visit include continue Effexor and as needed clonazepam .   She reports {excellent/good/fair/poor:19665} compliance with treatment. She {is/is not:21021397} having side effects. ***  She reports {DESC; GOOD/FAIR/POOR:18685} tolerance of treatment. Current symptoms include: {Symptoms; depression:1002} She feels she is {improved/worse/unchanged:3041574} since last visit.     10/28/2020   10:20 AM 09/08/2020    1:22 PM  Depression screen PHQ 2/9  Decreased Interest 2 1  Down, Depressed, Hopeless 1 1  PHQ - 2 Score 3 2  Altered sleeping 3 3  Tired, decreased energy 2 3  Change in appetite 0 1  Feeling bad or failure about yourself  0 2  Trouble concentrating 1 2  Moving slowly or fidgety/restless 2 0  Suicidal thoughts 0 0  PHQ-9 Score 11 13  Difficult doing work/chores Very difficult Very difficult      10/28/2020   10:20 AM 09/14/2020    4:06 PM  GAD 7 : Generalized Anxiety Score  Nervous, Anxious, on Edge 2 3  Control/stop worrying 3 3  Worry too much - different things 2 3  Trouble relaxing 3 2  Restless 2 2  Easily annoyed or irritable 1 2  Afraid - awful might happen 0 0  Total GAD 7 Score 13 15  Anxiety Difficulty Very difficult Very difficult    -----------------------------------------------------------------------------------------   Medications: Outpatient Medications Prior to Visit  Medication Sig    calcium carbonate (TUMS EX) 750 MG chewable tablet Chew 1 tablet by mouth daily.   clonazePAM (KLONOPIN) 0.5 MG tablet Take 1 tablet (0.5 mg total) by mouth 2 (two) times daily as needed for anxiety.   gabapentin (NEURONTIN) 100 MG capsule Take 100 mg by mouth 3 (three) times daily.   metaxalone (SKELAXIN) 800 MG tablet Take 1 tablet (800 mg total) by mouth 3 (three) times daily as needed for muscle spasms.   venlafaxine XR (EFFEXOR XR) 75 MG 24 hr capsule Take 3 capsules (225 mg total) by mouth daily with breakfast. Take one pill daily along with 150 mg tablet to total 225 mg daily.   No facility-administered medications prior to visit.    Review of Systems  {Labs  Heme  Chem  Endocrine  Serology  Results Review (optional):23779}   Objective    There were no vitals taken for this visit. {Show previous vital signs (optional):23777}  Physical Exam  ***  No results found for any visits on 10/22/22.  Assessment & Plan     ***  No follow-ups on file.      {provider attestation***:1}   Gwyneth Sprout, [place] [phone] (phone) [phone] (fax)  [place]

## 2022-10-22 ENCOUNTER — Ambulatory Visit: Payer: Medicaid Other | Admitting: Family Medicine

## 2022-11-12 ENCOUNTER — Encounter: Payer: Self-pay | Admitting: Family Medicine

## 2022-11-12 ENCOUNTER — Other Ambulatory Visit: Payer: Self-pay | Admitting: Family Medicine

## 2022-11-12 ENCOUNTER — Ambulatory Visit: Payer: Self-pay

## 2022-11-12 ENCOUNTER — Ambulatory Visit: Payer: Medicaid Other | Admitting: Family Medicine

## 2022-11-12 VITALS — BP 99/74 | HR 112 | Temp 98.2°F | Ht 65.0 in | Wt 135.2 lb

## 2022-11-12 DIAGNOSIS — F419 Anxiety disorder, unspecified: Secondary | ICD-10-CM

## 2022-11-12 DIAGNOSIS — F333 Major depressive disorder, recurrent, severe with psychotic symptoms: Secondary | ICD-10-CM

## 2022-11-12 DIAGNOSIS — F32A Depression, unspecified: Secondary | ICD-10-CM

## 2022-11-12 MED ORDER — VENLAFAXINE HCL ER 75 MG PO CP24
225.0000 mg | ORAL_CAPSULE | Freq: Every day | ORAL | 3 refills | Status: DC
Start: 2023-02-10 — End: 2022-12-14

## 2022-11-12 MED ORDER — VENLAFAXINE HCL ER 75 MG PO CP24: 225.0000 mg | ORAL_CAPSULE | Freq: Every day | ORAL | 0 refills | Status: DC

## 2022-11-12 NOTE — Assessment & Plan Note (Signed)
Severe, recurrent depression with current psych symptoms including aggression and altered reality Patient was previously prescribed 30 day supply of effexor 10/15/22 to get her to next OV given no OV in >12 months; however, patient reports that she ran out of the medication early and has been without the medication and is now having complaints of "brain zaps" BP is slightly low with complaints of ongoing fatigue, dizziness and family reported weight loss over past few months Previously tolerating 225 mg effexor (150/75- changed to 75x3)  Recommend full set of labs today given severe symptoms and family concern Encouraged to f/u at minimum of once year; advised patient she is due for a PAP smear and that can be scheduled with her f/u

## 2022-11-12 NOTE — Progress Notes (Signed)
I,J'ya E Hunter,acting as a scribe for Jacky Kindle, FNP.,have documented all relevant documentation on the behalf of Jacky Kindle, FNP,as directed by  Jacky Kindle, FNP while in the presence of Jacky Kindle, FNP. Established patient visit  Patient: Rachel Mercer   DOB: [birth year]   27 y.o. Female  MRN: [id number] Visit Date: 11/12/2022  Today's healthcare provider: Jacky Kindle, FNP  Introduced to nurse practitioner role and practice setting.  All questions answered.  Discussed provider/patient relationship and expectations.  Chief Complaint  Patient presents with   Medication Problem   Subjective    HPI  Patient presents without Effexor since Friday April 12th, 2024 and concern for acute symptoms  Medications: Outpatient Medications Prior to Visit  Medication Sig   [DISCONTINUED] venlafaxine XR (EFFEXOR XR) 75 MG 24 hr capsule Take 3 capsules (225 mg total) by mouth daily with breakfast.   [DISCONTINUED] calcium carbonate (TUMS EX) 750 MG chewable tablet Chew 1 tablet by mouth daily. (Patient not taking: Reported on 11/12/2022)   [DISCONTINUED] clonazePAM (KLONOPIN) 0.5 MG tablet Take 1 tablet (0.5 mg total) by mouth 2 (two) times daily as needed for anxiety. (Patient not taking: Reported on 11/12/2022)   [DISCONTINUED] gabapentin (NEURONTIN) 100 MG capsule Take 100 mg by mouth 3 (three) times daily. (Patient not taking: Reported on 11/12/2022)   [DISCONTINUED] metaxalone (SKELAXIN) 800 MG tablet Take 1 tablet (800 mg total) by mouth 3 (three) times daily as needed for muscle spasms. (Patient not taking: Reported on 11/12/2022)   No facility-administered medications prior to visit.    Review of Systems  Constitutional:  Positive for fatigue.  Gastrointestinal:  Positive for nausea. Negative for vomiting.  Neurological:  Positive for dizziness, light-headedness and headaches.  Psychiatric/Behavioral:  Positive for decreased concentration.      Objective    BP 99/74 (BP  Location: Right Arm, Patient Position: Sitting, Cuff Size: Normal)   Pulse (!) 112   Temp 98.2 F (36.8 C) (Oral)   Ht  (1.651 m)   Wt 135 lb 3.2 oz (61.3 kg)   SpO2 99%   BMI 22.50 kg/m   Physical Exam Vitals and nursing note reviewed.  Constitutional:      General: She is not in acute distress.    Appearance: Normal appearance. She is normal weight. She is ill-appearing. She is not toxic-appearing or diaphoretic.  HENT:     Head: Normocephalic and atraumatic.  Cardiovascular:     Rate and Rhythm: Regular rhythm. Tachycardia present.     Pulses: Normal pulses.     Heart sounds: Normal heart sounds. No murmur heard.    No friction rub. No gallop.  Pulmonary:     Effort: Pulmonary effort is normal. No respiratory distress.     Breath sounds: Normal breath sounds. No stridor. No wheezing, rhonchi or rales.  Chest:     Chest wall: No tenderness.  Abdominal:     General: Bowel sounds are normal.     Palpations: Abdomen is soft.     Tenderness: There is guarding.  Musculoskeletal:        General: No swelling, tenderness, deformity or signs of injury. Normal range of motion.     Right lower leg: No edema.     Left lower leg: No edema.  Skin:    General: Skin is warm and dry.     Capillary Refill: Capillary refill takes less than 2 seconds.     Coloration: Skin is not  jaundiced or pale.     Findings: No bruising, erythema, lesion or rash.  Neurological:     General: No focal deficit present.     Mental Status: She is alert and oriented to person, place, and time. Mental status is at baseline.     Cranial Nerves: No cranial nerve deficit.     Sensory: No sensory deficit.     Motor: No weakness.     Coordination: Coordination normal.  Psychiatric:        Mood and Affect: Mood normal. Affect is labile, blunt and angry.        Speech: She is noncommunicative.        Behavior: Behavior is agitated, aggressive and withdrawn.        Thought Content: Thought content does not  include homicidal or suicidal ideation. Thought content does not include homicidal or suicidal plan.        Cognition and Memory: Cognition and memory normal.        Judgment: Judgment is impulsive.     No results found for any visits on 11/12/22.  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 Patient was previously prescribed 30 day supply of effexor 10/15/22 to get her to next OV given no OV in >12 months; however, patient reports that she ran out of the medication early and has been without the medication and is now having complaints of "brain zaps" BP is slightly low with complaints of ongoing fatigue, dizziness and family reported weight loss over past few months Previously tolerating 225 mg effexor (150/75- changed to 75x3)  Recommend full set of labs today given severe symptoms and family concern Encouraged to f/u at minimum of once year; advised patient she is due for a PAP smear and that can be scheduled with her f/u       Relevant Medications   venlafaxine XR (EFFEXOR XR) 75 MG 24 hr capsule (Start on 02/10/2023)   Other Relevant Orders   CBC with Differential/Platelet   Comprehensive Metabolic Panel (CMET)   Lipid panel   TSH + free T4   Vitamin D (25 hydroxy)   Hemoglobin A1c   B12 and Folate Panel  Of note, patient is under the care of her mother. She does not have access to a vehicle and does not drive. She was accompanied by her mother to his appt given concern for affect without medication and possible medication misuse leading to lack of supply.  Return in about 6 months (around 05/14/2023) for annual examination w/PAP.     Leilani Merl, FNP, have reviewed all documentation for this visit. The documentation on 11/12/22 for the exam, diagnosis, procedures, and orders are all accurate and  complete.  Jacky Kindle, FNP  [facility] [phone] (phone) [phone] (fax)  [facility]

## 2022-11-12 NOTE — Telephone Encounter (Signed)
  Chief Complaint: Medication withdrawal  Symptoms: Can't focus, can't sleep out of meds since Friday Frequency: Friday Pertinent Negatives: Patient denies  Disposition: [] ED /[] Urgent Care (no appt availability in office) / [x] Appointment(In office/virtual)/ []  [place] [facility]/ [] Home Care/ [] Refused Recommended Disposition /[] [place] [facility]/ []  Follow-up with PCP Additional Notes: Received call from Pt's mother. Mother states that pt has been out of Effexor since Friday and is having withdrawal s/s. PT cannot focus. Mother would like a small supply of Effexor called in now, and has made an appt for OV this afternoon.   Called office requesting refill.   Reason for Disposition  [1] Prescription refill request for ESSENTIAL medicine (i.e., likelihood of harm to patient if not taken) AND [2] triager unable to refill per department policy  Answer Assessment - Initial Assessment Questions 1. DRUG NAME: "What medicine do you need to have refilled?"     Effexor 2. REFILLS REMAINING: "How many refills are remaining?" (Note: The label on the medicine or pill bottle will show how many refills are remaining. If there are no refills remaining, then a renewal may be needed.)     none 3. EXPIRATION DATE: "What is the expiration date?" (Note: The label states when the prescription will expire, and thus can no longer be refilled.)      4. PRESCRIBING HCP: "Who prescribed it?" Reason: If prescribed by specialist, call should be referred to that group.     [place] 5. SYMPTOMS: "Do you have any symptoms?"     Cannot focus  Protocols used: Medication Refill and Renewal Call-A-AH

## 2022-11-13 ENCOUNTER — Other Ambulatory Visit: Payer: Self-pay | Admitting: Family Medicine

## 2022-11-13 DIAGNOSIS — R718 Other abnormality of red blood cells: Secondary | ICD-10-CM

## 2022-11-13 LAB — CBC WITH DIFFERENTIAL/PLATELET
Basophils Absolute: 0.1 10*3/uL (ref 0.0–0.2)
Basos: 1 %
EOS (ABSOLUTE): 0.4 10*3/uL (ref 0.0–0.4)
Eos: 4 %
Hematocrit: 49.6 % — ABNORMAL HIGH (ref 34.0–46.6)
Hemoglobin: 17.4 g/dL — ABNORMAL HIGH (ref 11.1–15.9)
Immature Grans (Abs): 0 10*3/uL (ref 0.0–0.1)
Immature Granulocytes: 0 %
Lymphocytes Absolute: 2.7 10*3/uL (ref 0.7–3.1)
Lymphs: 26 %
MCH: 32.6 pg (ref 26.6–33.0)
MCHC: 35.1 g/dL (ref 31.5–35.7)
MCV: 93 fL (ref 79–97)
Monocytes Absolute: 0.6 10*3/uL (ref 0.1–0.9)
Monocytes: 6 %
Neutrophils Absolute: 6.6 10*3/uL (ref 1.4–7.0)
Neutrophils: 63 %
Platelets: 439 10*3/uL (ref 150–450)
RBC: 5.33 x10E6/uL — ABNORMAL HIGH (ref 3.77–5.28)
RDW: 11.8 % (ref 11.7–15.4)
WBC: 10.4 10*3/uL (ref 3.4–10.8)

## 2022-11-13 LAB — TSH+FREE T4
Free T4: 1.34 ng/dL (ref 0.82–1.77)
TSH: 0.974 u[IU]/mL (ref 0.450–4.500)

## 2022-11-13 LAB — COMPREHENSIVE METABOLIC PANEL
ALT: 19 IU/L (ref 0–32)
AST: 27 IU/L (ref 0–40)
Albumin/Globulin Ratio: 1.4 (ref 1.2–2.2)
Albumin: 4.9 g/dL (ref 4.0–5.0)
Alkaline Phosphatase: 98 IU/L (ref 44–121)
BUN/Creatinine Ratio: 13 (ref 9–23)
BUN: 12 mg/dL (ref 6–20)
Bilirubin Total: 0.5 mg/dL (ref 0.0–1.2)
CO2: 20 mmol/L (ref 20–29)
Calcium: 10.3 mg/dL — ABNORMAL HIGH (ref 8.7–10.2)
Chloride: 100 mmol/L (ref 96–106)
Creatinine, Ser: 0.93 mg/dL (ref 0.57–1.00)
Globulin, Total: 3.4 g/dL (ref 1.5–4.5)
Glucose: 100 mg/dL — ABNORMAL HIGH (ref 70–99)
Potassium: 4.5 mmol/L (ref 3.5–5.2)
Sodium: 138 mmol/L (ref 134–144)
Total Protein: 8.3 g/dL (ref 6.0–8.5)
eGFR: 86 mL/min/{1.73_m2} (ref >59)

## 2022-11-13 LAB — LIPID PANEL
Chol/HDL Ratio: 4.2 ratio (ref 0.0–4.4)
Cholesterol, Total: 187 mg/dL (ref 100–199)
HDL: 45 mg/dL (ref >39)
LDL Chol Calc (NIH): 130 mg/dL — ABNORMAL HIGH (ref 0–99)
Triglycerides: 65 mg/dL (ref 0–149)
VLDL Cholesterol Cal: 12 mg/dL (ref 5–40)

## 2022-11-13 LAB — HEMOGLOBIN A1C
Est. average glucose Bld gHb Est-mCnc: 103 mg/dL
Hgb A1c MFr Bld: 5.2 % (ref 4.8–5.6)

## 2022-11-13 LAB — B12 AND FOLATE PANEL
Folate: >20 ng/mL (ref >3.0)
Vitamin B-12: 733 pg/mL (ref 232–1245)

## 2022-11-13 LAB — VITAMIN D 25 HYDROXY (VIT D DEFICIENCY, FRACTURES): Vit D, 25-Hydroxy: 38 ng/mL (ref 30.0–100.0)

## 2022-11-13 NOTE — Progress Notes (Signed)
Labs indicated elevated red blood cells as well as iron containing cells. Recommendation to follow up with hematology to prevent other organ involvement. Bad/LDL cholesterol elevated. I continue to recommend diet low in saturated fat and regular exercise - 30 min at least 5 times per week

## 2022-12-14 ENCOUNTER — Other Ambulatory Visit: Payer: Self-pay

## 2022-12-14 DIAGNOSIS — F333 Major depressive disorder, recurrent, severe with psychotic symptoms: Secondary | ICD-10-CM

## 2022-12-14 MED ORDER — VENLAFAXINE HCL ER 75 MG PO CP24
225.0000 mg | ORAL_CAPSULE | Freq: Every day | ORAL | 3 refills | Status: DC
Start: 2022-12-14 — End: 2023-03-21

## 2022-12-14 NOTE — Telephone Encounter (Signed)
Resending d/t start date was for 02/11/23 so pharmacy wouldn't fill.   Requested Prescriptions  Pending Prescriptions Disp Refills   venlafaxine XR (EFFEXOR XR) 75 MG 24 hr capsule 270 capsule 3    Sig: Take 3 capsules (225 mg total) by mouth daily with breakfast.     Psychiatry: Antidepressants - SNRI - desvenlafaxine & venlafaxine Failed - 12/14/2022  4:03 PM      Failed - Lipid Panel in normal range within the last 12 months    Cholesterol, Total  Date Value Ref Range Status  11/12/2022 187 100 - 199 mg/dL Final   LDL Chol Calc (NIH)  Date Value Ref Range Status  11/12/2022 130 (H) 0 - 99 mg/dL Final   HDL  Date Value Ref Range Status  11/12/2022 45 >39 mg/dL Final   Triglycerides  Date Value Ref Range Status  11/12/2022 65 0 - 149 mg/dL Final         Passed - Cr in normal range and within 360 days    Creatinine  Date Value Ref Range Status  05/08/2013 0.73 0.60 - 1.30 mg/dL Final   Creatinine, Ser  Date Value Ref Range Status  11/12/2022 0.93 0.57 - 1.00 mg/dL Final         Passed - Completed PHQ-2 or PHQ-9 in the last 360 days      Passed - Last BP in normal range    BP Readings from Last 1 Encounters:  11/12/22 99/74         Passed - Valid encounter within last 6 months    Recent Outpatient Visits           1 month ago Severe episode of recurrent major depressive disorder, with psychotic features [facility])   [facility] Jacky Kindle, FNP   1 year ago Chronic left-sided thoracic back pain   [facility] [place], [place]   2 years ago Depression, unspecified depression type   [facility] [place], [place]   4 years ago Possible pregnancy   [facility] [place], [place], [place]   5 years ago Acute midline low back pain without sciatica   [facility] [place], [place], [place]

## 2022-12-26 ENCOUNTER — Other Ambulatory Visit: Payer: Self-pay | Admitting: Family Medicine

## 2022-12-30 IMAGING — US US OB COMP +14 WK
1 series · 13 of 28 positions shown · non-contrast
Comparison: none

CLINICAL DATA: Third trimester pregnancy.  Evaluate fetal growth.

EXAM:
OBSTETRICAL ULTRASOUND >14 WKS

[Series 1: us ob comp + 14 wk · 13 of 58 slices shown]
[im 3/58]
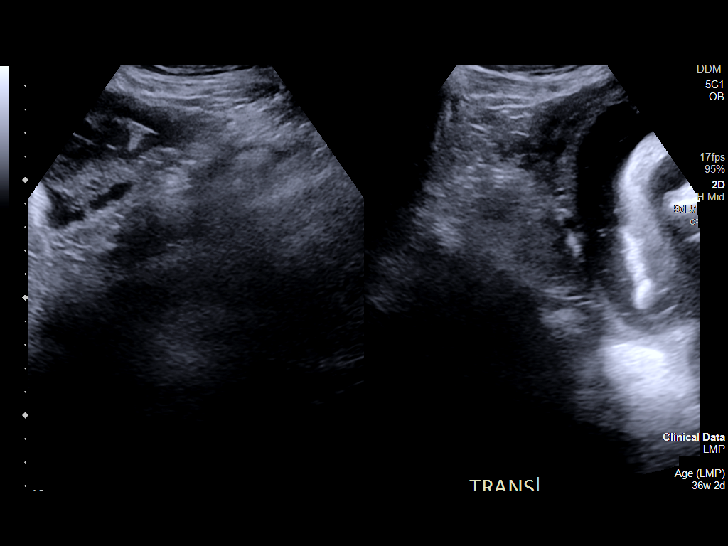
[im 7/58]
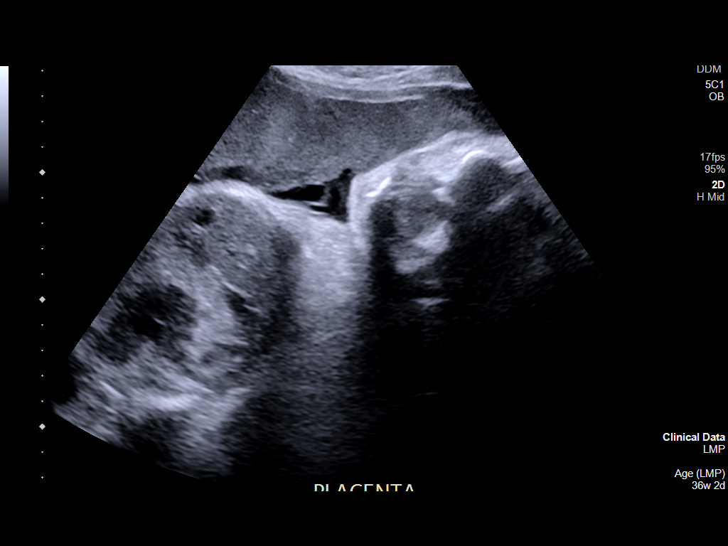
[im 11/58]
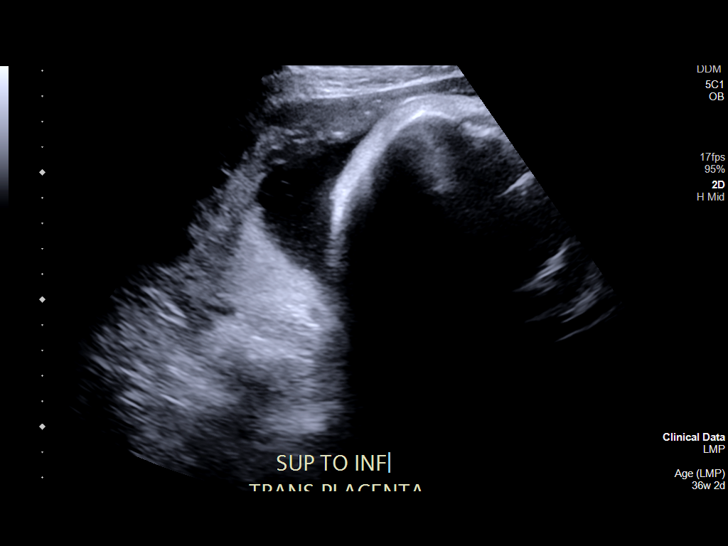
[im 15/58]
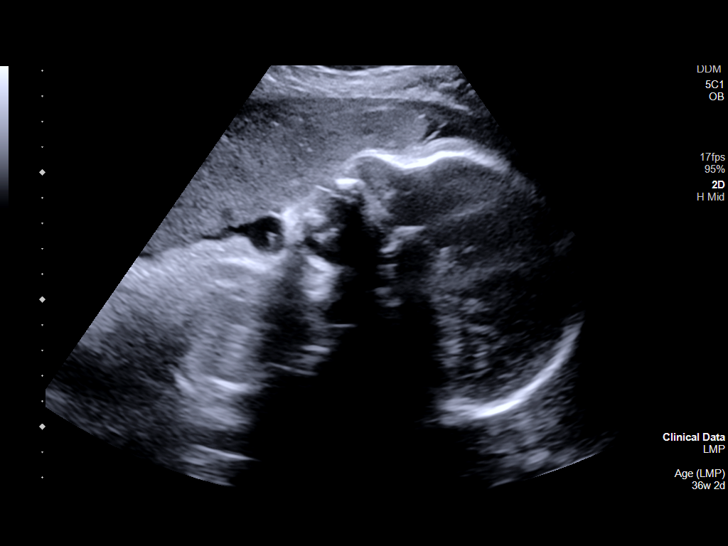
[im 20/58]
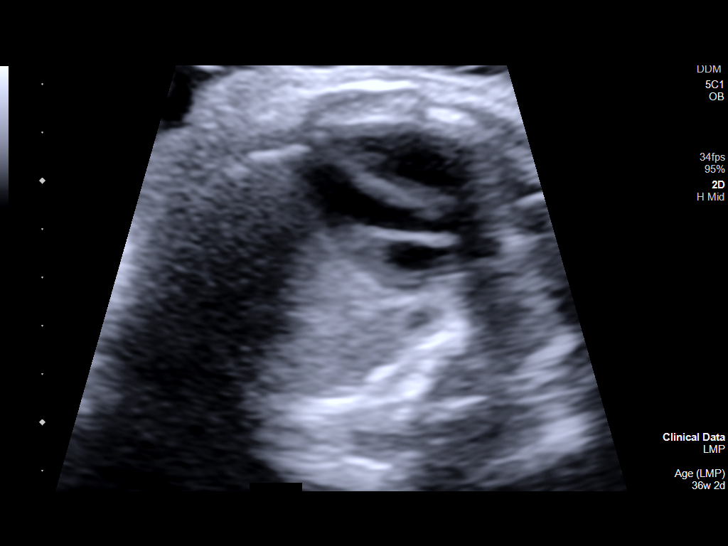
[im 24/58]
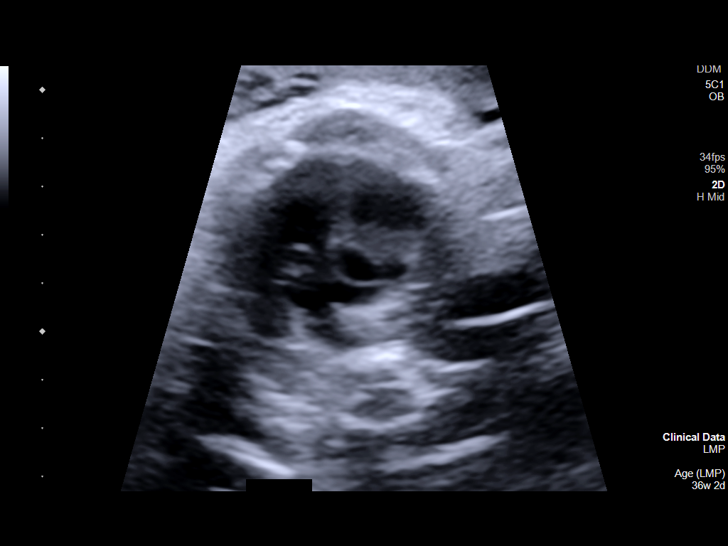
[im 30/58]
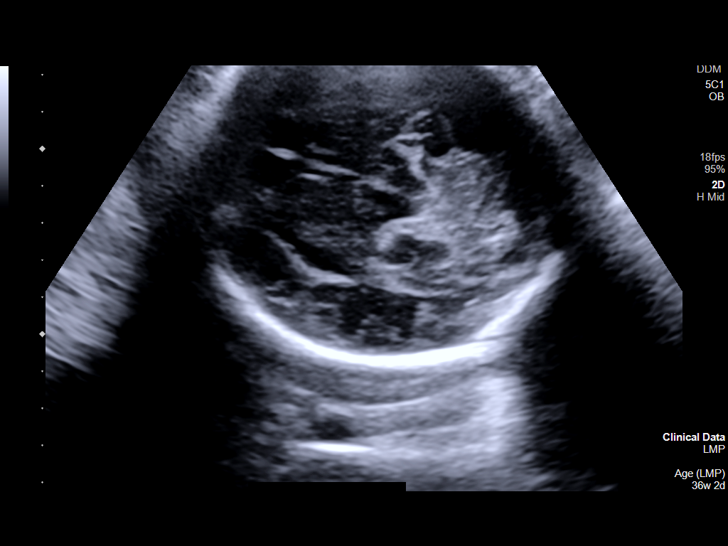
[im 34/58]
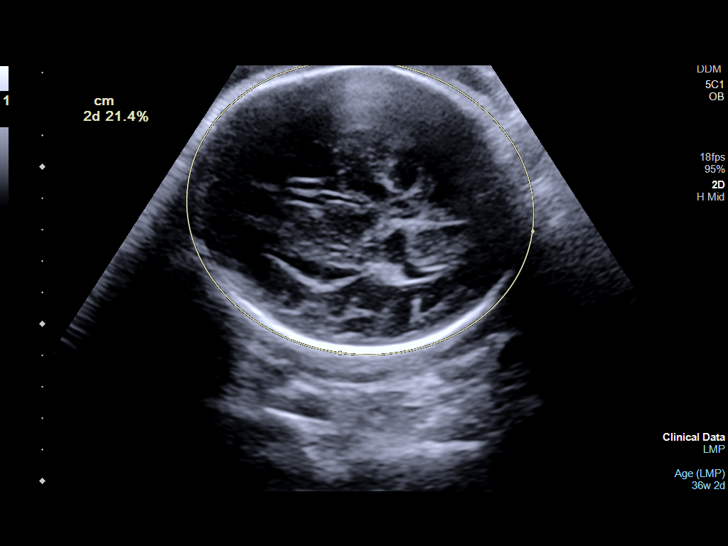
[im 39/58]
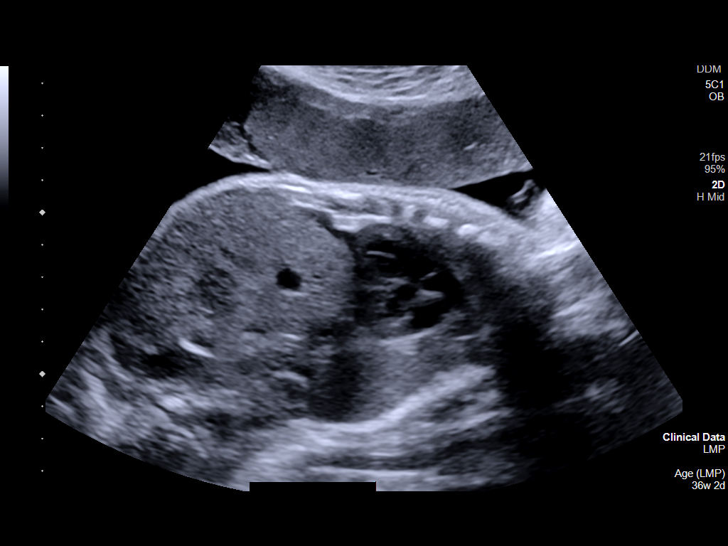
[im 43/58]
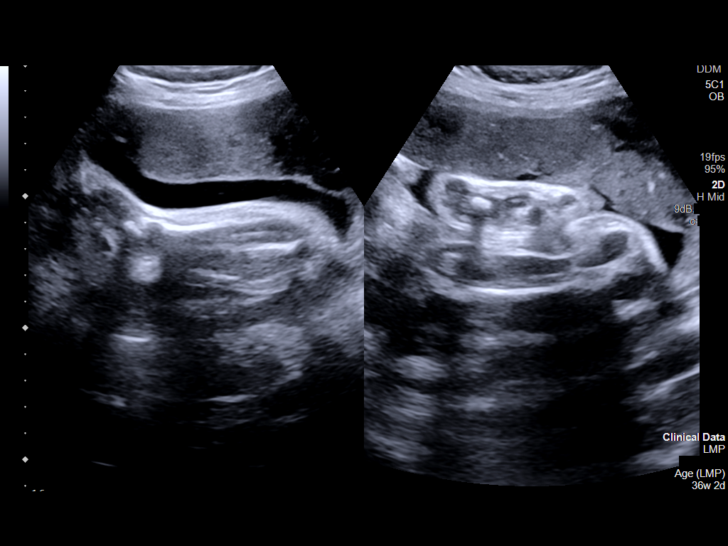
[im 47/58]
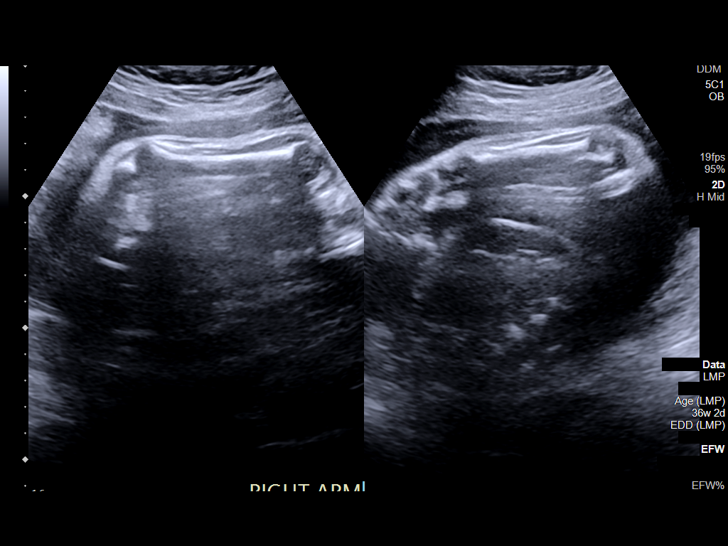
[im 51/58]
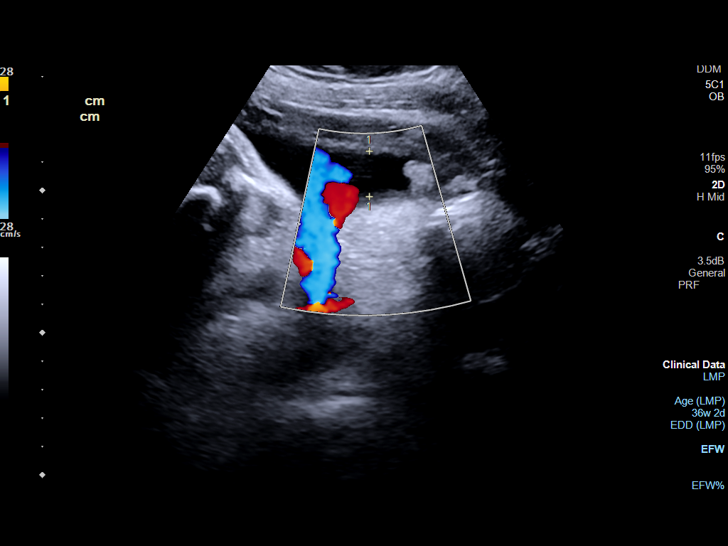
[im 55/58]
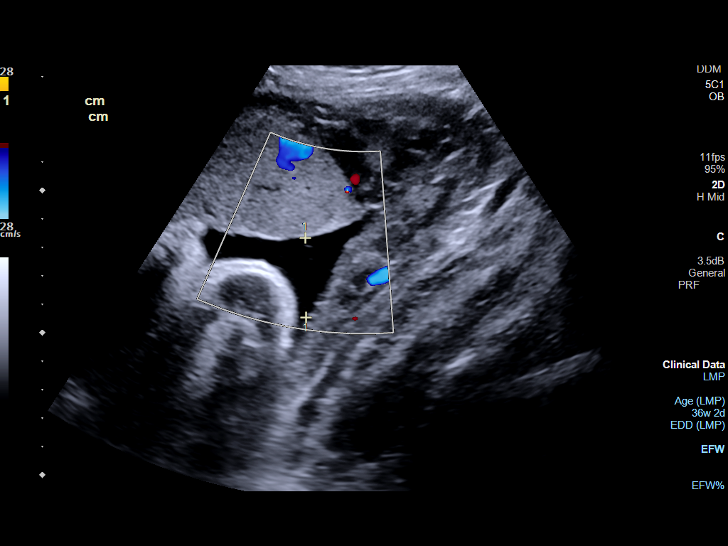

[13 of 28 positions shown; findings below may reference images not displayed]

FINDINGS: Number of Fetuses: 1

Heart Rate:  144 bpm

Movement: Yes

Presentation: Cephalic

Previa: No

Placental Location: Anterior

Amniotic Fluid (Subjective): Within normal limits

Amniotic Fluid (Objective):

Vertical pocket = 7.0cm

AFI = 16.4 cm (5%ile= 7.7 cm, 95%= 24.9 cm for 36 wks)

FETAL BIOMETRY

BPD: 9.1cm 36w 6d

HC:   32.2cm 36w 2d

AC:   30.0cm 33w 6d

FL:   6.6cm 34w 1d

Current Mean GA: 34w 6d US EDC: 11/24/2020

Assigned GA:  36w 2d Assigned EDC: 11/14/2020

Estimated Fetal Weight:  2,435g 11%ile

FETAL ANATOMY

Lateral Ventricles: Appears normal

Thalami/CSP: Appears normal

Posterior Fossa:  Appears normal

Nuchal Region: Appears normal   NFT= N/A > 20 WKS

Upper Lip: Appears normal

Spine: Limited views appear normal

4 Chamber Heart on Left: Appears normal

LVOT: Appears normal

RVOT: Appears normal

Stomach on Left: Appears normal

3 Vessel Cord: Appears normal

Cord Insertion site: Appears normal

Kidneys: Appears normal

Bladder: Appears normal

Extremities: Appears normal

Technically difficult due to: Advanced gestational age and fetal
position

Maternal Findings:

Cervix:  Not evaluated (>34 wks)
IMPRESSION: Assigned GA currently 36 weeks 2 days. AC measures 2 weeks behind
and EFW is at 11th %ile, suspicious for developing asymmetric IUGR.

Amniotic fluid volume within normal limits, with AFI of 16.4 cm.

## 2023-03-20 ENCOUNTER — Other Ambulatory Visit: Payer: Self-pay | Admitting: Family Medicine

## 2023-03-20 DIAGNOSIS — F333 Major depressive disorder, recurrent, severe with psychotic symptoms: Secondary | ICD-10-CM

## 2023-03-20 NOTE — Telephone Encounter (Addendum)
Medication Refill - Medication:  venlafaxine XR (EFFEXOR XR) 75 MG 24 hr capsulec almost out (only enough for tonight)  *Gets bad withdrawals without this medication*  Has the patient contacted their pharmacy? Yes.  Advised to contact pcp  Preferred Pharmacy (with phone number or street name):  [id number] [place], [place] - [address]  Phone: [phone] Fax: [phone]  Has the patient been seen for an appointment in the last year OR does the patient have an upcoming appointment? Yes.

## 2023-03-21 MED ORDER — VENLAFAXINE HCL ER 75 MG PO CP24
225.0000 mg | ORAL_CAPSULE | Freq: Every day | ORAL | 3 refills | Status: DC
Start: 2023-03-21 — End: 2023-06-24

## 2023-03-21 NOTE — Telephone Encounter (Signed)
Last labs 11/12/22.  Requested Prescriptions  Pending Prescriptions Disp Refills   venlafaxine XR (EFFEXOR XR) 75 MG 24 hr capsule 270 capsule 3    Sig: Take 3 capsules (225 mg total) by mouth daily with breakfast.     Psychiatry: Antidepressants - SNRI - desvenlafaxine & venlafaxine Failed - 03/21/2023  8:36 AM      Failed - Lipid Panel in normal range within the last 12 months    Cholesterol, Total  Date Value Ref Range Status  11/12/2022 187 100 - 199 mg/dL Final   LDL Chol Calc (NIH)  Date Value Ref Range Status  11/12/2022 130 (H) 0 - 99 mg/dL Final   HDL  Date Value Ref Range Status  11/12/2022 45 >39 mg/dL Final   Triglycerides  Date Value Ref Range Status  11/12/2022 65 0 - 149 mg/dL Final         Passed - Cr in normal range and within 360 days    Creatinine  Date Value Ref Range Status  05/08/2013 0.73 0.60 - 1.30 mg/dL Final   Creatinine, Ser  Date Value Ref Range Status  11/12/2022 0.93 0.57 - 1.00 mg/dL Final         Passed - Completed PHQ-2 or PHQ-9 in the last 360 days      Passed - Last BP in normal range    BP Readings from Last 1 Encounters:  11/12/22 99/74         Passed - Valid encounter within last 6 months    Recent Outpatient Visits           4 months ago Severe episode of recurrent major depressive disorder, with psychotic features [facility])   [place] [facility] Jacky Kindle, FNP   1 year ago Chronic left-sided thoracic back pain   [place] [facility] Bosie Clos, [place]   2 years ago Depression, unspecified depression type   [facility] Trey Sailors, [place]   4 years ago Possible pregnancy   [place] [facility] [place], [place], [place]   5 years ago Acute midline low back pain without sciatica   [facility] [place], [place], [place]

## 2023-06-24 ENCOUNTER — Ambulatory Visit: Payer: Self-pay

## 2023-06-24 ENCOUNTER — Other Ambulatory Visit: Payer: Self-pay | Admitting: Family Medicine

## 2023-06-24 DIAGNOSIS — F333 Major depressive disorder, recurrent, severe with psychotic symptoms: Secondary | ICD-10-CM

## 2023-06-24 MED ORDER — VENLAFAXINE HCL ER 75 MG PO CP24
225.0000 mg | ORAL_CAPSULE | Freq: Every day | ORAL | 0 refills | Status: DC
Start: 2023-06-24 — End: 2023-06-25

## 2023-06-24 NOTE — Telephone Encounter (Signed)
Courtesy refill given.   Requested Prescriptions  Pending Prescriptions Disp Refills   venlafaxine XR (EFFEXOR XR) 75 MG 24 hr capsule 90 capsule 0    Sig: Take 3 capsules (225 mg total) by mouth daily with breakfast.     Psychiatry: Antidepressants - SNRI - desvenlafaxine & venlafaxine Failed - 06/24/2023  5:20 PM      Failed - Valid encounter within last 6 months    Recent Outpatient Visits           7 months ago Severe episode of recurrent major depressive disorder, with psychotic features [facility])   [place] [facility] Merita Norton T, FNP   2 years ago Chronic left-sided thoracic back pain   [place] [facility] Bosie Clos, MD   2 years ago Depression, unspecified depression type   [facility], Ricki Rodriguez M, PA-C   5 years ago Possible pregnancy   [place] [facility] [place], Victorino Dike M, [place]   5 years ago Acute midline low back pain without sciatica   [facility] [place], [place], [place]              Failed - Lipid Panel in normal range within the last 12 months    Cholesterol, Total  Date Value Ref Range Status  11/12/2022 187 100 - 199 mg/dL Final   LDL Chol Calc (NIH)  Date Value Ref Range Status  11/12/2022 130 (H) 0 - 99 mg/dL Final   HDL  Date Value Ref Range Status  11/12/2022 45 >39 mg/dL Final   Triglycerides  Date Value Ref Range Status  11/12/2022 65 0 - 149 mg/dL Final         Passed - Cr in normal range and within 360 days    Creatinine  Date Value Ref Range Status  05/08/2013 0.73 0.60 - 1.30 mg/dL Final   Creatinine, Ser  Date Value Ref Range Status  11/12/2022 0.93 0.57 - 1.00 mg/dL Final         Passed - Completed PHQ-2 or PHQ-9 in the last 360 days      Passed - Last BP in normal range    BP Readings from Last 1 Encounters:  11/12/22 99/74

## 2023-06-24 NOTE — Telephone Encounter (Signed)
Pt called x 2, no answer, recording "call cannot be completed at this time..."  Pt's rx request was in que so reviewed per protocol and gave #30/0 for rx. Will route to practice since no working # for pt.   Summary: sleepy + not motivated + brain zap   Patient states she is out of her anti-depressent medication and not having depression but thinks she is withdrawing from the medication. Patient states she is really tired, not motivated, brain zap. Patient ran out the day before yesterday, did not have any medication last night.   There has been an RX refill request sent for patient. Advised patient of the 3 business day rule and that I was sending the nurse a message about symptoms.  Patient also states she has not tried to contact Pharmacy.  venlafaxine XR (EFFEXOR XR) 75 MG 24 hr capsule  [id number] [place], [place] - [address] Phone: [phone] Fax: [phone]  Patients callback # [phone]

## 2023-06-24 NOTE — Telephone Encounter (Signed)
Medication Refill -  Most Recent Primary Care Visit:  Provider: Merita Norton T  Department: [facility]  Visit Type: OFFICE VISIT  Date: 11/12/2022  Medication: venlafaxine XR (EFFEXOR XR) 75 MG 24 hr capsule [[id number]] Pt took her last pill on Saturday.  Has the patient contacted their pharmacy? Yes (Agent: If no, request that the patient contact the pharmacy for the refill. If patient does not wish to contact the pharmacy document the reason why and proceed with request.) (Agent: If yes, when and what did the pharmacy advise?)  Is this the correct pharmacy for this prescription? Yes If no, delete pharmacy and type the correct one.  This is the patient's preferred pharmacy:  [facility] [id number] [place], [place] - [address] [place] [address] Phone: [phone] Fax: [phone]  [id number] [place], [place] - [address] [place] [address] Phone: [phone] Fax: [phone]  [facility] [address] [place] [facility] [address] [place] [address] Phone: [phone] Fax: [phone]   Has the prescription been filled recently? Yes  Is the patient out of the medication? Yes  Has the patient been seen for an appointment in the last year OR does the patient have an upcoming appointment? Yes  Can we respond through MyChart? NO  Agent: Please be advised that Rx refills may take up to 3 business days. We ask that you follow-up with your pharmacy.

## 2023-06-25 ENCOUNTER — Other Ambulatory Visit: Payer: Self-pay | Admitting: Family Medicine

## 2023-06-25 DIAGNOSIS — F333 Major depressive disorder, recurrent, severe with psychotic symptoms: Secondary | ICD-10-CM

## 2023-06-25 MED ORDER — VENLAFAXINE HCL ER 75 MG PO CP24: 225.0000 mg | ORAL_CAPSULE | Freq: Every day | ORAL | 1 refills | Status: DC

## 2023-11-21 ENCOUNTER — Other Ambulatory Visit: Payer: Self-pay

## 2024-03-26 ENCOUNTER — Telehealth: Payer: Self-pay | Admitting: Family Medicine

## 2024-03-26 NOTE — Telephone Encounter (Signed)
 [facility] Mull[facility] refills on Venlaxafine HCL ER 75 mg. #270

## 2024-03-27 ENCOUNTER — Other Ambulatory Visit: Payer: Self-pay

## 2024-03-27 DIAGNOSIS — F333 Major depressive disorder, recurrent, severe with psychotic symptoms: Secondary | ICD-10-CM

## 2024-03-27 NOTE — Telephone Encounter (Signed)
 Converted into a refill request

## 2024-03-28 MED ORDER — VENLAFAXINE HCL ER 75 MG PO CP24: 225.0000 mg | ORAL_CAPSULE | Freq: Every day | ORAL | 0 refills | Status: DC

## 2024-03-28 NOTE — Telephone Encounter (Signed)
 30-day courtesy refill sent.  Ok to give 2nd 30-day courtesy fill as long as appointment is scheduled, with added note to keep that appointment.

## 2024-04-02 ENCOUNTER — Telehealth: Payer: Self-pay | Admitting: Family Medicine

## 2024-04-02 ENCOUNTER — Other Ambulatory Visit: Payer: Self-pay

## 2024-04-02 DIAGNOSIS — F333 Major depressive disorder, recurrent, severe with psychotic symptoms: Secondary | ICD-10-CM

## 2024-04-02 NOTE — Telephone Encounter (Signed)
 Copied from CRM [id number]. Topic: Clinical - Medication Refill >> Apr 02, 2024  1:14 PM Charlet HERO wrote: Medication: venlafaxine  XR (EFFEXOR  XR) 75 MG 24 hr capsule  Has the patient contacted their pharmacy? Yes Sent to incorrect provider This is the patient's preferred pharmacy:  [facility] [id number] [place], [place] - [address] [place] [address] Phone: [phone] Fax: [phone]   Is this the correct pharmacy for this prescription? Yes If no, delete pharmacy and type the correct one.   Has the prescription been filled recently? Yes  Is the patient out of the medication? Yes  Has the patient been seen for an appointment in the last year OR does the patient have an upcoming appointment? Yes  Can we respond through MyChart? Yes  Agent: Please be advised that Rx refills may take up to 3 business days. We ask that you follow-up with your pharmacy.

## 2024-04-02 NOTE — Telephone Encounter (Signed)
 Called but was only able to leave a voice message, in regards to a refill request for effexor  xr, after reviewing her chart the medication was sent to the pharmacy on file on 03/28/24, per the dispense report a 90 day supply was picked up on the same day.

## 2024-04-02 NOTE — Telephone Encounter (Signed)
 Converted

## 2024-05-04 ENCOUNTER — Encounter: Payer: Self-pay | Admitting: Family Medicine

## 2024-05-04 ENCOUNTER — Ambulatory Visit: Payer: MEDICAID | Admitting: Family Medicine

## 2024-05-04 VITALS — BP 98/64 | HR 104 | Ht 65.0 in | Wt 133.0 lb

## 2024-05-04 DIAGNOSIS — F331 Major depressive disorder, recurrent, moderate: Secondary | ICD-10-CM

## 2024-05-04 DIAGNOSIS — F411 Generalized anxiety disorder: Secondary | ICD-10-CM | POA: Insufficient documentation

## 2024-05-04 MED ORDER — VENLAFAXINE HCL ER 75 MG PO CP24: 225.0000 mg | ORAL_CAPSULE | Freq: Every day | ORAL | 1 refills | Status: DC

## 2024-05-04 NOTE — Progress Notes (Signed)
 Acute visit   Patient: Rachel Mercer   DOB: 12/17/1995   28 y.o. Female  MRN: [id number] PCP: Franchot Isaiah LABOR, MD   Chief Complaint  Patient presents with   Medication Refill   Depression    She  was last seen for this 11/12/2022. Changes made at last visit include n/a. She reports excellent compliance with treatment. She is not having side effects. She reports excellent tolerance of treatment. Current symptoms include: feelings of worthlessness/guilt, hopelessness, and insomnia. She feels she is Improved since last visit.   Subjective    Discussed the use of AI scribe software for clinical note transcription with the patient, who gave verbal consent to proceed.  History of Present Illness   Rachel Mercer is a 28 year old female with depression who presents for a medication refill.  She takes Effexor  225 mg daily, using three 75 mg tablets, which effectively controls her depression. She has been out of her medication for one day, having taken her last dose on Saturday night, and is experiencing mild withdrawal symptoms. She experiences no side effects from Effexor  when taken regularly, except for withdrawal symptoms when not taken.  Her anxiety has improved since discontinuing Klonopin , which she had been taking for a long time. Stopping Klonopin  reduced her anxiety, particularly the anxiety associated with obtaining a controlled substance monthly. Effexor  has been beneficial for both her depression and anxiety.  She has not had a physical exam or Pap smear in a long time.         05/04/2024    1:32 PM 10/28/2020   10:20 AM 09/14/2020    4:06 PM  GAD 7 : Generalized Anxiety Score  Nervous, Anxious, on Edge 3 2 3   Control/stop worrying 2 3 3   Worry too much - different things 2 2 3   Trouble relaxing 1 3 2   Restless 0 2 2  Easily annoyed or irritable 0 1 2  Afraid - awful might happen 1 0 0  Total GAD 7 Score 9 13 15   Anxiety Difficulty Somewhat difficult Very  difficult Very difficult        05/04/2024    1:32 PM 11/12/2022    4:09 PM 10/28/2020   10:20 AM 09/08/2020    1:22 PM  Depression screen PHQ 2/9  Decreased Interest 1 1 2 1   Down, Depressed, Hopeless 1 1 1 1   PHQ - 2 Score 2 2 3 2   Altered sleeping 1 2 3 3   Tired, decreased energy 1 2 2 3   Change in appetite 1 2 0 1  Feeling bad or failure about yourself  0 2 0 2  Trouble concentrating 1 1 1 2   Moving slowly or fidgety/restless 0 1 2 0  Suicidal thoughts 0 1 0 0  PHQ-9 Score 6 13 11 13   Difficult doing work/chores Somewhat difficult  Very difficult Very difficult      Review of Systems  Objective    BP 98/64 (BP Location: Left Arm, Patient Position: Sitting, Cuff Size: Normal)   Pulse (!) 104   Ht 5' 5 (1.651 m)   Wt 133 lb (60.3 kg)   SpO2 98%   Breastfeeding No   BMI 22.13 kg/m    Physical Exam Vitals reviewed.  Constitutional:      General: She is not in acute distress.    Appearance: She is well-developed.  HENT:     Head: Normocephalic and atraumatic.  Eyes:  General: No scleral icterus.    Conjunctiva/sclera: Conjunctivae normal.  Cardiovascular:     Rate and Rhythm: Normal rate and regular rhythm.  Pulmonary:     Effort: Pulmonary effort is normal. No respiratory distress.  Skin:    General: Skin is warm and dry.     Findings: No rash.  Neurological:     Mental Status: She is alert and oriented to person, place, and time.  Psychiatric:        Behavior: Behavior normal.       No results found for any visits on 05/04/24.  Assessment & Plan     Problem List Items Addressed This Visit       Other   Clinical depression - Primary   Relevant Medications   venlafaxine  XR (EFFEXOR  XR) 75 MG 24 hr capsule   GAD (generalized anxiety disorder)   Relevant Medications   venlafaxine  XR (EFFEXOR  XR) 75 MG 24 hr capsule        Depression Depression is well-controlled with Effexor  (venlafaxine ) 225 mg daily. She reports no side effects, and  the medication effectively manages her symptoms. She experienced mild withdrawal symptoms after missing one day of medication, expected to resolve upon resumption. - Refill Effexor  225 mg for two months - [facility] [address]  Anxiety Anxiety symptoms have improved after discontinuing Klonopin  (clonazepam ), attributed to reduced stress from not managing a controlled substance prescription. Effexor  also provides benefit for anxiety symptoms.  General Health Maintenance She has not had a physical examination or Pap smear recently. - Recommend scheduling a physical examination with Dr. Franchot - Recommend Pap smear during the physical examination        Meds ordered this encounter  Medications   venlafaxine  XR (EFFEXOR  XR) 75 MG 24 hr capsule    Sig: Take 3 capsules (225 mg total) by mouth daily with breakfast.    Dispense:  90 capsule    Refill:  1     Return in about 2 months (around 07/04/2024) for CPE, With PCP.      Jon Eva, MD  [facility] [phone] (phone) [phone] (fax)  [facility]

## 2024-05-26 ENCOUNTER — Other Ambulatory Visit: Payer: Self-pay | Admitting: Family Medicine

## 2024-05-26 DIAGNOSIS — F331 Major depressive disorder, recurrent, moderate: Secondary | ICD-10-CM

## 2024-05-26 DIAGNOSIS — F411 Generalized anxiety disorder: Secondary | ICD-10-CM

## 2024-07-06 NOTE — Progress Notes (Deleted)
 Complete physical exam   Patient: Rachel Mercer   DOB: 1995-12-09   28 y.o. Female  MRN: [id number] Visit Date: 07/06/2024  Today's healthcare provider: Isaiah DELENA Pepper, MD   No chief complaint on file.  Subjective    Rachel Mercer is a 28 y.o. female who presents today for a complete physical exam.  She reports consuming a {diet types:17450} diet. {Exercise:19826} She generally feels {well/fairly well/poorly:18703}. She reports sleeping {well/fairly well/poorly:18703}. She {does/does not:200015} have additional problems to discuss today.    Discussed the use of AI scribe software for clinical note transcription with the patient, who gave verbal consent to proceed.  History of Present Illness     Last depression screening scores    05/04/2024    1:32 PM 11/12/2022    4:09 PM 10/28/2020   10:20 AM  PHQ 2/9 Scores  PHQ - 2 Score 2 2 3   PHQ- 9 Score 6  13  11       Data saved with a previous flowsheet row definition   Last fall risk screening    11/12/2022    4:09 PM  Fall Risk   Falls in the past year? 0  Number falls in past yr: 0  Injury with Fall? 0   Risk for fall due to : No Fall Risks     Data saved with a previous flowsheet row definition    {VISON DENTAL STD PSA (Optional):27386}  {History (Optional):23778}  Medications: Outpatient Medications Prior to Visit  Medication Sig   venlafaxine  XR (EFFEXOR -XR) 75 MG 24 hr capsule TAKE 3 CAPSULES (225 MG TOTAL) BY MOUTH DAILY WITH BREAKFAST   No facility-administered medications prior to visit.    Review of Systems as noted in HPI.  {Insert previous labs (optional):23779} {See past labs  Heme  Chem  Endocrine  Serology  Results Review (optional):1}  Objective    There were no vitals taken for this visit. {Insert last BP/Wt (optional):23777}{See vitals history (optional):1}  Physical Exam   No results found for any visits on 07/06/24.  Assessment & Plan    Routine Health  Maintenance and Physical Exam  Exercise Activities and Dietary recommendations  Goals   None     Immunization History  Administered Date(s) Administered   Hepatitis A 11/27/2007, 06/07/2010   Hepatitis B 03-10-1995, 02/26/1995, 09/18/1995   Influenza Nasal 05/22/2012   Influenza Split 06/07/2010   Influenza,inj,Quad PF,6+ Mos 06/01/2020   MMR 01/02/1996, 01/13/1999   Meningococcal Conjugate 06/07/2010   Tdap 03/05/2007, 09/29/2020   Varicella 01/02/1996, 12/18/2005    Health Maintenance  Topic Date Due   Cervical Cancer Screening (Pap smear)  Never done   HPV VACCINES (1 - 3-dose SCDM series) Never done   COVID-19 Vaccine (1 - 2025-26 season) Never done   DTaP/Tdap/Td (3 - Td or Tdap) 09/30/2030   Hepatitis B Vaccines 19-59 Average Risk  Completed   Hepatitis C Screening  Completed   HIV Screening  Completed   Pneumococcal Vaccine  Aged Out   Meningococcal B Vaccine  Aged Out   Influenza Vaccine  Discontinued    Discussed health benefits of physical activity, and encouraged her to engage in regular exercise appropriate for her age and condition.  Problem List Items Addressed This Visit   None   Assessment and Plan Assessment & Plan       No follow-ups on file.     Isaiah DELENA Pepper, MD  [facility] [phone] (phone) [phone] (  fax)
